# Patient Record
Sex: Male | Born: 1940
Health system: Southern US, Community
[De-identification: ages and names within clinical notes are randomized; demographics above are authoritative.]

## PROBLEM LIST (undated history)

## (undated) DIAGNOSIS — K219 Gastro-esophageal reflux disease without esophagitis: Secondary | ICD-10-CM

## (undated) DIAGNOSIS — Z974 Presence of external hearing-aid: Secondary | ICD-10-CM

## (undated) DIAGNOSIS — N400 Enlarged prostate without lower urinary tract symptoms: Secondary | ICD-10-CM

## (undated) DIAGNOSIS — M1990 Unspecified osteoarthritis, unspecified site: Secondary | ICD-10-CM

## (undated) HISTORY — PX: ESOPHAGOGASTRODUODENOSCOPY: SHX1529

## (undated) HISTORY — PX: TONSILLECTOMY: SUR1361

---

## 2007-05-21 ENCOUNTER — Ambulatory Visit: Payer: Self-pay | Admitting: Ophthalmology

## 2007-06-13 ENCOUNTER — Ambulatory Visit: Payer: Self-pay | Admitting: Gastroenterology

## 2009-10-23 HISTORY — PX: COLONOSCOPY: SHX174

## 2010-03-23 ENCOUNTER — Ambulatory Visit: Payer: Self-pay | Admitting: Gastroenterology

## 2010-07-04 ENCOUNTER — Ambulatory Visit: Payer: Self-pay | Admitting: Gastroenterology

## 2012-01-25 ENCOUNTER — Ambulatory Visit: Payer: Self-pay | Admitting: Internal Medicine

## 2014-04-14 LAB — CBC AND DIFFERENTIAL: Hemoglobin: 15.4 g/dL (ref 13.5–17.5)

## 2014-04-14 LAB — LIPID PANEL
Cholesterol: 189 mg/dL (ref 0–200)
HDL: 45 mg/dL (ref 35–70)
LDL Cholesterol: 112 mg/dL
Triglycerides: 161 mg/dL — AB (ref 40–160)

## 2014-04-14 LAB — PSA: PSA: 1.6

## 2014-04-14 LAB — BASIC METABOLIC PANEL
BUN: 11 mg/dL (ref 4–21)
Creatinine: 1 mg/dL (ref 0.6–1.3)

## 2015-10-07 ENCOUNTER — Encounter: Payer: Self-pay | Admitting: Internal Medicine

## 2015-10-07 ENCOUNTER — Other Ambulatory Visit: Payer: Self-pay | Admitting: Internal Medicine

## 2015-10-07 DIAGNOSIS — N138 Other obstructive and reflux uropathy: Secondary | ICD-10-CM | POA: Insufficient documentation

## 2015-10-07 DIAGNOSIS — D485 Neoplasm of uncertain behavior of skin: Secondary | ICD-10-CM | POA: Insufficient documentation

## 2015-10-07 DIAGNOSIS — Z86010 Personal history of colonic polyps: Secondary | ICD-10-CM | POA: Insufficient documentation

## 2015-10-07 DIAGNOSIS — N401 Enlarged prostate with lower urinary tract symptoms: Secondary | ICD-10-CM

## 2015-10-07 DIAGNOSIS — M722 Plantar fascial fibromatosis: Secondary | ICD-10-CM | POA: Insufficient documentation

## 2016-12-17 ENCOUNTER — Encounter: Payer: Self-pay | Admitting: Internal Medicine

## 2016-12-20 ENCOUNTER — Ambulatory Visit: Payer: Self-pay | Admitting: Internal Medicine

## 2017-01-26 DIAGNOSIS — R7309 Other abnormal glucose: Secondary | ICD-10-CM | POA: Diagnosis not present

## 2017-01-26 DIAGNOSIS — Z125 Encounter for screening for malignant neoplasm of prostate: Secondary | ICD-10-CM | POA: Diagnosis not present

## 2017-01-26 DIAGNOSIS — Z0000 Encounter for general adult medical examination without abnormal findings: Secondary | ICD-10-CM | POA: Diagnosis not present

## 2017-01-26 DIAGNOSIS — D369 Benign neoplasm, unspecified site: Secondary | ICD-10-CM | POA: Diagnosis not present

## 2017-03-28 DIAGNOSIS — K219 Gastro-esophageal reflux disease without esophagitis: Secondary | ICD-10-CM | POA: Diagnosis not present

## 2017-03-28 DIAGNOSIS — Z86010 Personal history of colonic polyps: Secondary | ICD-10-CM | POA: Diagnosis not present

## 2017-05-15 ENCOUNTER — Ambulatory Visit: Payer: Self-pay | Admitting: Internal Medicine

## 2017-07-31 DIAGNOSIS — Z0000 Encounter for general adult medical examination without abnormal findings: Secondary | ICD-10-CM | POA: Insufficient documentation

## 2017-07-31 DIAGNOSIS — Z23 Encounter for immunization: Secondary | ICD-10-CM | POA: Diagnosis not present

## 2017-07-31 DIAGNOSIS — R972 Elevated prostate specific antigen [PSA]: Secondary | ICD-10-CM | POA: Diagnosis not present

## 2017-08-06 ENCOUNTER — Encounter: Payer: Self-pay | Admitting: *Deleted

## 2017-08-07 ENCOUNTER — Encounter: Payer: Self-pay | Admitting: *Deleted

## 2017-08-07 ENCOUNTER — Encounter
Admission: RE | Disposition: A | Discharge status: Home / Self Care | Payer: Self-pay | Source: Ambulatory Visit | Attending: Gastroenterology

## 2017-08-07 ENCOUNTER — Ambulatory Visit
Admission: RE | Admit: 2017-08-07 | Discharge: 2017-08-07 | Disposition: A | Discharge status: Home / Self Care | Payer: PPO | Source: Ambulatory Visit | Attending: Gastroenterology | Admitting: Gastroenterology

## 2017-08-07 ENCOUNTER — Ambulatory Visit: Payer: PPO | Admitting: Anesthesiology

## 2017-08-07 DIAGNOSIS — D126 Benign neoplasm of colon, unspecified: Secondary | ICD-10-CM | POA: Diagnosis not present

## 2017-08-07 DIAGNOSIS — Z79899 Other long term (current) drug therapy: Secondary | ICD-10-CM | POA: Diagnosis not present

## 2017-08-07 DIAGNOSIS — Q438 Other specified congenital malformations of intestine: Secondary | ICD-10-CM | POA: Insufficient documentation

## 2017-08-07 DIAGNOSIS — K5730 Diverticulosis of large intestine without perforation or abscess without bleeding: Secondary | ICD-10-CM | POA: Diagnosis not present

## 2017-08-07 DIAGNOSIS — D124 Benign neoplasm of descending colon: Secondary | ICD-10-CM | POA: Insufficient documentation

## 2017-08-07 DIAGNOSIS — Z86010 Personal history of colonic polyps: Secondary | ICD-10-CM | POA: Insufficient documentation

## 2017-08-07 DIAGNOSIS — D122 Benign neoplasm of ascending colon: Secondary | ICD-10-CM | POA: Insufficient documentation

## 2017-08-07 DIAGNOSIS — N400 Enlarged prostate without lower urinary tract symptoms: Secondary | ICD-10-CM | POA: Diagnosis not present

## 2017-08-07 DIAGNOSIS — D125 Benign neoplasm of sigmoid colon: Secondary | ICD-10-CM | POA: Diagnosis not present

## 2017-08-07 DIAGNOSIS — K640 First degree hemorrhoids: Secondary | ICD-10-CM | POA: Insufficient documentation

## 2017-08-07 DIAGNOSIS — K648 Other hemorrhoids: Secondary | ICD-10-CM | POA: Diagnosis not present

## 2017-08-07 DIAGNOSIS — K5790 Diverticulosis of intestine, part unspecified, without perforation or abscess without bleeding: Secondary | ICD-10-CM | POA: Diagnosis not present

## 2017-08-07 DIAGNOSIS — K635 Polyp of colon: Secondary | ICD-10-CM | POA: Diagnosis not present

## 2017-08-07 DIAGNOSIS — Z1211 Encounter for screening for malignant neoplasm of colon: Secondary | ICD-10-CM | POA: Insufficient documentation

## 2017-08-07 HISTORY — PX: COLONOSCOPY WITH PROPOFOL: SHX5780

## 2017-08-07 HISTORY — DX: Benign prostatic hyperplasia without lower urinary tract symptoms: N40.0

## 2017-08-07 SURGERY — COLONOSCOPY WITH PROPOFOL
Anesthesia: General

## 2017-08-07 MED ORDER — PROPOFOL 500 MG/50ML IV EMUL
INTRAVENOUS | Status: AC
  Filled 2017-08-07: qty 50

## 2017-08-07 MED ORDER — PROPOFOL 10 MG/ML IV BOLUS
INTRAVENOUS | Status: DC | PRN
  Administered 2017-08-07: 100 mg via INTRAVENOUS

## 2017-08-07 MED ORDER — EPHEDRINE SULFATE 50 MG/ML IJ SOLN
INTRAMUSCULAR | Status: DC | PRN
  Administered 2017-08-07: 5 mg via INTRAVENOUS
  Administered 2017-08-07: 10 mg via INTRAVENOUS

## 2017-08-07 MED ORDER — FENTANYL CITRATE (PF) 100 MCG/2ML IJ SOLN
INTRAMUSCULAR | Status: AC
  Filled 2017-08-07: qty 2

## 2017-08-07 MED ORDER — SODIUM CHLORIDE 0.9 % IV SOLN: INTRAVENOUS | Status: DC

## 2017-08-07 MED ORDER — PHENYLEPHRINE HCL 10 MG/ML IJ SOLN
INTRAMUSCULAR | Status: DC | PRN
  Administered 2017-08-07 (×3): 100 ug via INTRAVENOUS

## 2017-08-07 MED ORDER — FENTANYL CITRATE (PF) 100 MCG/2ML IJ SOLN
INTRAMUSCULAR | Status: DC | PRN
  Administered 2017-08-07: 50 ug via INTRAVENOUS

## 2017-08-07 MED ORDER — LIDOCAINE 2% (20 MG/ML) 5 ML SYRINGE
INTRAMUSCULAR | Status: DC | PRN
  Administered 2017-08-07: 40 mg via INTRAVENOUS

## 2017-08-07 MED ORDER — SODIUM CHLORIDE 0.9 % IV SOLN
INTRAVENOUS | Status: DC
  Administered 2017-08-07: 1000 mL via INTRAVENOUS
  Administered 2017-08-07: 12:00:00 via INTRAVENOUS

## 2017-08-07 MED ORDER — PROPOFOL 500 MG/50ML IV EMUL
INTRAVENOUS | Status: DC | PRN
  Administered 2017-08-07: 160 ug/kg/min via INTRAVENOUS

## 2017-08-07 NOTE — Op Note (Signed)
[facility] Gastroenterology Patient Name: Joel Carter Procedure Date: 08/07/2017 12:22 PM MRN: [id number] Account #: [id number] Date of Birth: 10-11-1941 Admit Type: Outpatient Age: 76 Room: [facility] ENDO ROOM 3 Gender: Male Note Status: Finalized Procedure:            Colonoscopy Indications:          Personal history of colonic polyps Providers:            Lollie Sails, MD Referring MD:         [name], MD (Referring MD) Medicines:            Monitored Anesthesia Care Complications:        No immediate complications. Procedure:            Pre-Anesthesia Assessment:                       - ASA Grade Assessment: III - A patient with severe                        systemic disease.                       After obtaining informed consent, the colonoscope was                        passed under direct vision. Throughout the procedure,                        the patient's blood pressure, pulse, and oxygen                        saturations were monitored continuously. The                        Colonoscope was introduced through the anus and                        advanced to the the cecum, identified by appendiceal                        orifice and ileocecal valve. The colonoscopy was                        performed with moderate difficulty due to a tortuous                        colon. Successful completion of the procedure was aided                        by changing the patient to a supine position, changing                        the patient to a prone position and using manual                        pressure. The patient tolerated the procedure well. The                        quality of the bowel preparation was good. Findings:      A 3 mm polyp  was found in the mid sigmoid colon. The polyp was sessile.       The polyp was removed with a cold biopsy forceps. Resection and       retrieval were complete.      A 3 mm polyp was found in the  descending colon. The polyp was sessile.       The polyp was removed with a cold biopsy forceps. Resection and       retrieval were complete.      A 4 mm polyp was found in the descending colon. The polyp was sessile.       The polyp was removed with a cold snare. Resection and retrieval were       complete.      Two sessile and semi-pedunculated polyps were found in the proximal       ascending colon. The polyps were 2 to 4 mm in size. These polyps were       removed with a cold biopsy forceps. Resection and retrieval were       complete.      A few small-mouthed diverticula were found in the sigmoid colon and       distal descending colon.      Non-bleeding internal hemorrhoids were found during anoscopy. The       hemorrhoids were small and Grade I (internal hemorrhoids that do not       prolapse).      The digital rectal exam was normal otherwise. Impression:           - One 3 mm polyp in the mid sigmoid colon, removed with                        a cold biopsy forceps. Resected and retrieved.                       - One 3 mm polyp in the descending colon, removed with                        a cold biopsy forceps. Resected and retrieved.                       - One 4 mm polyp in the descending colon, removed with                        a cold snare. Resected and retrieved.                       - Two 2 to 4 mm polyps in the proximal ascending colon,                        removed with a cold biopsy forceps. Resected and                        retrieved.                       - Diverticulosis in the sigmoid colon and in the distal                        descending colon.                       -  Non-bleeding internal hemorrhoids. Recommendation:       - Discharge patient to home.                       - Telephone GI clinic for pathology results in 1 week. Procedure Code(s):    --- Professional ---                       [phone], Colonoscopy, flexible; with removal of tumor(s),                         polyp(s), or other lesion(s) by snare technique                       45380, 21, Colonoscopy, flexible; with biopsy, single                        or multiple Diagnosis Code(s):    --- Professional ---                       D12.5, Benign neoplasm of sigmoid colon                       D12.4, Benign neoplasm of descending colon                       D12.2, Benign neoplasm of ascending colon                       K64.0, First degree hemorrhoids                       Z86.010, Personal history of colonic polyps                       K57.30, Diverticulosis of large intestine without                        perforation or abscess without bleeding CPT copyright 2016 American Medical Association. All rights reserved. The codes documented in this report are preliminary and upon coder review may  be revised to meet current compliance requirements. Lollie Sails, MD 08/07/2017 1:24:28 PM This report has been signed electronically. Number of Addenda: 0 Note Initiated On: 08/07/2017 12:22 PM Scope Withdrawal Time: 0 hours 21 minutes 57 seconds  Total Procedure Duration: 0 hours 50 minutes 22 seconds       [facility]

## 2017-08-07 NOTE — Transfer of Care (Signed)
Immediate Anesthesia Transfer of Care Note  Patient: Joel Carter  Procedure(s) Performed: COLONOSCOPY WITH PROPOFOL (N/A )  Patient Location: PACU and Endoscopy Unit  Anesthesia Type:General  Level of Consciousness: sedated  Airway & Oxygen Therapy: Patient Spontanous Breathing and Patient connected to nasal cannula oxygen  Post-op Assessment: Report given to RN and Post -op Vital signs reviewed and stable  Post vital signs: Reviewed and stable  Last Vitals:  Vitals:   08/07/17 1203  BP: (!) 141/80  Pulse: 66  Resp: 16  Temp: (!) 36 C  SpO2: 100%    Last Pain:  Vitals:   08/07/17 1203  TempSrc: Tympanic         Complications: No apparent anesthesia complications

## 2017-08-07 NOTE — H&P (Signed)
Outpatient short stay form Pre-procedure 08/07/2017 12:13 PM Lollie Sails MD  Primary Physician: Dr. Emily Filbert  Reason for visit:  colonoscopy  History of present illness:  Patient is a 76-year-ve. He tolerated his prep well. He takes no aspirin or blood thinning agents. His last colonoscopy was in 2011 with a single tubular adenoma at that time.    Current Facility-Administered Medications:  .  0.9 %  sodium chloride infusion, , Intravenous, Continuous, Lollie Sails, MD .  0.9 %  sodium chloride infusion, , Intravenous, Continuous, Lollie Sails, MD  Prescriptions Prior to Admission  Medication Sig Dispense Refill Last Dose  . polyethylene glycol powder (GLYCOLAX/MIRALAX) powder Take 1 Container by mouth once.   Past Week at Unknown time  . tamsulosin (FLOMAX) 0.4 MG CAPS capsule Take 0.4 mg by mouth daily.   Past Week at Unknown time  . dutasteride (AVODART) 0.5 MG capsule Take 1 capsule by mouth at bedtime.   Not Taking at Unknown time     No Known Allergies   Past Medical History:  Diagnosis Date  . BPH without obstruction/lower urinary tract symptoms     Review of systems:      Physical Exam    Heart and lungs: regular rate and rhythm without rub or gallop, lungs are bilaterally clear    HEENT: normocephalic atraumatic eyes are anicteric    Other:     Pertinant exam for procedure: soft nontender nondistended bowel sounds normoactive    Planned proceedures: colonoscopy and indicated procedures I have discussed the risks benefits and complications of procedures to include not limited to bleeding, infection, perforation and the risk of sedation and the patient wishes to proceed.    Lollie Sails, MD Gastroenterology 08/07/2017  12:13 PM

## 2017-08-07 NOTE — Anesthesia Postprocedure Evaluation (Signed)
Anesthesia Post Note  Patient: Joel Carter  Procedure(s) Performed: COLONOSCOPY WITH PROPOFOL (N/A )  Patient location during evaluation: Endoscopy Anesthesia Type: General Level of consciousness: awake and alert Pain management: pain level controlled Vital Signs Assessment: post-procedure vital signs reviewed and stable Respiratory status: spontaneous breathing and respiratory function stable Cardiovascular status: stable Anesthetic complications: no     Last Vitals:  Vitals:   08/07/17 1325 08/07/17 1335  BP: (!) 101/58 (!) 95/56  Pulse: 63 61  Resp: 13 19  Temp: (!) 36.1 C   SpO2: 98%     Last Pain:  Vitals:   08/07/17 1325  TempSrc: Tympanic                 KEPHART,WILLIAM K

## 2017-08-07 NOTE — Anesthesia Post-op Follow-up Note (Signed)
Anesthesia QCDR form completed.        

## 2017-08-07 NOTE — Anesthesia Preprocedure Evaluation (Signed)
Anesthesia Evaluation  Patient identified by MRN, date of birth, ID band Patient awake    Reviewed: Allergy & Precautions, NPO status , Patient's Chart, lab work & pertinent test results  History of Anesthesia Complications Negative for: history of anesthetic complications  Airway Mallampati: II       Dental   Pulmonary neg sleep apnea, neg COPD,           Cardiovascular (-) hypertension(-) Past MI and (-) CHF (-) dysrhythmias (-) Valvular Problems/Murmurs     Neuro/Psych neg Seizures    GI/Hepatic Neg liver ROS, neg GERD  ,  Endo/Other  neg diabetes  Renal/GU negative Renal ROS     Musculoskeletal   Abdominal   Peds  Hematology   Anesthesia Other Findings   Reproductive/Obstetrics                             Anesthesia Physical Anesthesia Plan  ASA: I  Anesthesia Plan: General   Post-op Pain Management:    Induction: Intravenous  PONV Risk Score and Plan: Propofol infusion  Airway Management Planned: Nasal Cannula  Additional Equipment:   Intra-op Plan:   Post-operative Plan:   Informed Consent: I have reviewed the patients History and Physical, chart, labs and discussed the procedure including the risks, benefits and alternatives for the proposed anesthesia with the patient or authorized representative who has indicated his/her understanding and acceptance.     Plan Discussed with:   Anesthesia Plan Comments:         Anesthesia Quick Evaluation

## 2017-08-08 ENCOUNTER — Encounter: Payer: Self-pay | Admitting: Gastroenterology

## 2017-08-08 LAB — SURGICAL PATHOLOGY

## 2017-08-22 ENCOUNTER — Ambulatory Visit: Payer: PPO | Admitting: Urology

## 2017-08-22 ENCOUNTER — Encounter: Payer: Self-pay | Admitting: Urology

## 2017-08-22 VITALS — BP 122/79 | HR 77 | Resp 3 | Ht 68.0 in | Wt 218.1 lb

## 2017-08-22 DIAGNOSIS — R972 Elevated prostate specific antigen [PSA]: Secondary | ICD-10-CM

## 2017-08-22 DIAGNOSIS — N401 Enlarged prostate with lower urinary tract symptoms: Secondary | ICD-10-CM | POA: Diagnosis not present

## 2017-08-22 DIAGNOSIS — N138 Other obstructive and reflux uropathy: Secondary | ICD-10-CM

## 2017-08-22 LAB — URINALYSIS, COMPLETE
BILIRUBIN UA: NEGATIVE
Glucose, UA: NEGATIVE
Ketones, UA: NEGATIVE
Leukocytes, UA: NEGATIVE
NITRITE UA: NEGATIVE
PH UA: 6.5 (ref 5.0–7.5)
PROTEIN UA: NEGATIVE
RBC UA: NEGATIVE
SPEC GRAV UA: 1.015 (ref 1.005–1.030)
UUROB: 0.2 mg/dL (ref 0.2–1.0)

## 2017-08-22 MED ORDER — DIAZEPAM 10 MG PO TABS: ORAL_TABLET | ORAL | 0 refills | Status: DC

## 2017-08-22 NOTE — Progress Notes (Signed)
08/22/2017 3:22 PM   [name] January 29, 1941 [id number]  Referring provider: [name], MD [place] [facility] [place], [place] [address]  Chief Complaint  Patient presents with  . Elevated PSA    HPI: Joel Carter is a 76 y o. male seen in consultation at the request of Dr. Sabra Heck for evaluation of an elevated PSA.  A PSA performed in April 2018 was 5.28 which was repeated in October 2018 and was 5.56.  He has moderate lower urinary tract symptoms consisting of urinary frequency, urgency, nocturia x2-3 and decreased force and caliber of his stream with intermittency.  He is on tamsulosin.  He had previously seen Dr. Carolin Coy and was on Avodart in 2014 which he took for approximately 1 year but discontinued because he could not donate blood.  A PSA in 2015 was 1.6.  He denies dysuria or gross hematuria.  Denies flank, abdominal, pelvic or scrotal pain.  He denies prior urologic evaluation.   PMH: Past Medical History:  Diagnosis Date  . BPH without obstruction/lower urinary tract symptoms     Surgical History: Past Surgical History:  Procedure Laterality Date  . COLONOSCOPY  2011   tubular adenoma  . COLONOSCOPY WITH PROPOFOL N/A 08/07/2017   Procedure: COLONOSCOPY WITH PROPOFOL;  Surgeon: Lollie Sails, MD;  Location: [facility] ENDOSCOPY;  Service: Endoscopy;  Laterality: N/A;  . ESOPHAGOGASTRODUODENOSCOPY    . TONSILLECTOMY      Home Medications:  Allergies as of 08/22/2017   No Known Allergies     Medication List       Accurate as of 08/22/17  3:22 PM. Always use your most recent med list.          diazepam 10 MG tablet Commonly known as:  VALIUM 1 tab 30 min prior to procedure   multivitamin capsule Take 1 capsule by mouth daily.   tamsulosin 0.4 MG Caps capsule Commonly known as:  FLOMAX Take 0.4 mg by mouth daily.       Allergies: No Known Allergies  Family History: Family History  Problem Relation  Age of Onset  . Diabetes Unknown        multiple family members  . Dementia Mother     Social History:  reports that he has never smoked. He has never used smokeless tobacco. He reports that he drinks about 2.4 oz of alcohol per week . He reports that he does not use drugs.  ROS: UROLOGY Frequent Urination?: Yes Hard to postpone urination?: Yes Burning/pain with urination?: No Get up at night to urinate?: Yes Leakage of urine?: No Urine stream starts and stops?: Yes Trouble starting stream?: No Do you have to strain to urinate?: No Blood in urine?: No Urinary tract infection?: No Sexually transmitted disease?: No Injury to kidneys or bladder?: No Painful intercourse?: No Weak stream?: Yes Erection problems?: No Penile pain?: No  Gastrointestinal Nausea?: No Vomiting?: No Indigestion/heartburn?: No Diarrhea?: No Constipation?: No  Constitutional Fever: No Night sweats?: No Weight loss?: No Fatigue?: No  Skin Skin rash/lesions?: No Itching?: No  Eyes Blurred vision?: No Double vision?: No  Ears/Nose/Throat Sore throat?: No Sinus problems?: No  Hematologic/Lymphatic Swollen glands?: No Easy bruising?: No  Cardiovascular Leg swelling?: No Chest pain?: No  Respiratory Cough?: No Shortness of breath?: No  Endocrine Excessive thirst?: No  Musculoskeletal Back pain?: No Joint pain?: No  Neurological Headaches?: No Dizziness?: No  Psychologic Depression?: No Anxiety?: No  Physical Exam: BP 122/79 (BP Location:  Right Arm, Patient Position: Sitting, Cuff Size: Normal)   Pulse 77   Resp (!) 3   Ht 5\' 8"  (1.727 m)   Wt 218 lb 1.6 oz (98.9 kg)   BMI 33.16 kg/m   Constitutional:  Alert and oriented, No acute distress. HEENT: [place] AT, moist mucus membranes.  Trachea midline, no masses. Cardiovascular: No clubbing, cyanosis, or edema. Respiratory: Normal respiratory effort, no increased work of breathing. GI: Abdomen is soft, nontender,  nondistended, no abdominal masses GU: No CVA tenderness.  Penis circumcised without lesions, testes descended bilaterally and are slightly atrophic.  No masses or tenderness.  Cord/epididymes palpably normal.  Prostate 50 g, smooth without nodules. Skin: No rashes, bruises or suspicious lesions. Lymph: No cervical or inguinal adenopathy. Neurologic: Grossly intact, no focal deficits, moving all 4 extremities. Psychiatric: Normal mood and affect.  Laboratory Data: Lab Results  Component Value Date   HGB 15.4 04/14/2014    Lab Results  Component Value Date   CREATININE 1.0 04/14/2014    Urinalysis Dipstick/microscopy negative   Assessment & Plan:    1. Elevated PSA Although PSA is a prostate cancer screening test he was informed that cancer is not the most common cause of an elevated PSA. Other potential causes including BPH and inflammation were discussed. He was informed that the only way to adequately diagnose prostate cancer would be a transrectal ultrasound and biopsy of the prostate. The procedure was discussed including potential risks of bleeding and infection/sepsis. He was also informed that a negative biopsy does not conclusively rule out the possibility that prostate cancer may be present and that continued monitoring is required. The use of newer adjunctive blood tests including PHI was discussed. The use of multiparametric prostate MRI was also discussed however is not typically used for initial evaluation of an elevated PSA. Continued periodic surveillance was also discussed.   He states he is concerned about the possibility of prostate cancer and desires to schedule a biopsy.  The procedure was discussed including potential risks of bleeding and infection/sepsis.   2. Benign prostatic hyperplasia with urinary obstruction Moderate lower urinary tract symptoms on tamsulosin.  He inquired about going back on Avodart however I recommended until we get the biopsy results.  -  Urinalysis, Complete    Abbie Sons, MD  [facility] [address], [place],  [address] [phone]

## 2017-08-29 ENCOUNTER — Other Ambulatory Visit: Payer: Self-pay | Admitting: Urology

## 2017-08-29 ENCOUNTER — Encounter: Payer: Self-pay | Admitting: Urology

## 2017-08-29 ENCOUNTER — Ambulatory Visit: Payer: PPO | Admitting: Urology

## 2017-08-29 VITALS — BP 133/87 | HR 83 | Ht 68.0 in | Wt 218.2 lb

## 2017-08-29 DIAGNOSIS — R972 Elevated prostate specific antigen [PSA]: Secondary | ICD-10-CM | POA: Diagnosis not present

## 2017-08-29 MED ORDER — LEVOFLOXACIN 500 MG PO TABS
500.0000 mg | ORAL_TABLET | Freq: Once | ORAL | Status: AC
Start: 2017-08-29 — End: 2017-08-29
  Administered 2017-08-29: 500 mg via ORAL

## 2017-08-29 MED ORDER — LIDOCAINE HCL 2 % EX GEL
1.0000 "application " | Freq: Once | CUTANEOUS | Status: AC
  Administered 2017-08-29: 1 via URETHRAL

## 2017-08-29 MED ORDER — GENTAMICIN SULFATE 40 MG/ML IJ SOLN
80.0000 mg | Freq: Once | INTRAMUSCULAR | Status: AC
  Administered 2017-08-29: 80 mg via INTRAMUSCULAR

## 2017-08-29 NOTE — Progress Notes (Signed)
Prostate Biopsy Procedure   Informed consent was obtained after discussing risks/benefits of the procedure.  A time out was performed to ensure correct patient identity.  Pre-Procedure: - Last PSA Level: 5.56  - Gentamicin given prophylactically - Levaquin 500 mg administered PO -Transrectal Ultrasound performed revealing a 75 gm prostate -No significant hypoechoic or median lobe noted  Procedure: - Prostate block performed using 10 cc 1% lidocaine and biopsies taken from sextant areas, a total of 12 under ultrasound guidance.  Post-Procedure: - Patient tolerated the procedure well - He was counseled to seek immediate medical attention if experiences any severe pain, significant bleeding, or fevers - Return in one week to discuss biopsy results

## 2017-09-04 ENCOUNTER — Other Ambulatory Visit: Payer: Self-pay | Admitting: Urology

## 2017-09-04 LAB — PATHOLOGY REPORT

## 2017-09-05 ENCOUNTER — Telehealth: Payer: Self-pay | Admitting: Urology

## 2017-09-05 NOTE — Telephone Encounter (Signed)
Please notify patient his prostate biopsy showed no evidence of cancer.  Recommend a follow-up PSA/DRE in 6 months.  His follow-up appointment can be canceled unless he needs to discuss any other issues.

## 2017-09-06 ENCOUNTER — Ambulatory Visit (INDEPENDENT_AMBULATORY_CARE_PROVIDER_SITE_OTHER): Payer: PPO

## 2017-09-06 VITALS — BP 131/78 | HR 84 | Ht 68.0 in | Wt 218.0 lb

## 2017-09-06 DIAGNOSIS — R300 Dysuria: Secondary | ICD-10-CM | POA: Diagnosis not present

## 2017-09-06 LAB — URINALYSIS, COMPLETE
BILIRUBIN UA: NEGATIVE
Glucose, UA: NEGATIVE
Ketones, UA: NEGATIVE
Nitrite, UA: POSITIVE — AB
PH UA: 7 (ref 5.0–7.5)
PROTEIN UA: NEGATIVE
Specific Gravity, UA: 1.015 (ref 1.005–1.030)
UUROB: 0.2 mg/dL (ref 0.2–1.0)

## 2017-09-06 LAB — MICROSCOPIC EXAMINATION: EPITHELIAL CELLS (NON RENAL): NONE SEEN /HPF (ref 0–10)

## 2017-09-06 MED ORDER — CIPROFLOXACIN HCL 250 MG PO TABS: 250.0000 mg | ORAL_TABLET | Freq: Two times a day (BID) | ORAL | 0 refills | Status: AC

## 2017-09-06 NOTE — Telephone Encounter (Signed)
LMOM

## 2017-09-06 NOTE — Telephone Encounter (Signed)
Spoke with pt in reference to negative prostate bx and needing a f/u in 33mo. Pt voiced understanding. OV and lab appt made. Orders placed.   Pt also stated that he does not feel that the flomax is working. Pt stated that he was previously was taking advodart and but quit taking it as he was concerned about giving blood. Pt inquired about going back on advodart or another medication to help him empty his bladder. Please advise.

## 2017-09-06 NOTE — Progress Notes (Signed)
Pt presented today with c/o urinary frequency and urgency, hard to postpone urination, dysuria, and leakage of urine. A clean catch was obtained for u/a and cx.   Blood pressure 131/78, pulse 84, height 5\' 8"  (1.727 m), weight 218 lb (98.9 kg).  Per Larene [place] cipro 250mg  bid x14 days was called into pt pharmacy.

## 2017-09-07 NOTE — Telephone Encounter (Signed)
It looks like urinalysis yesterday is positive for infection.  Joel Carter seen in [place].  If his symptoms have been worse since the biopsy it is most likely due to infection.  Avodart is going to take at least 4 months before he will see any benefit.  Please verify when symptoms worsened.  Thanks

## 2017-09-08 ENCOUNTER — Emergency Department
Admission: EM | Admit: 2017-09-08 | Discharge: 2017-09-08 | Disposition: A | Discharge status: Home / Self Care | Payer: PPO | Attending: Emergency Medicine | Admitting: Emergency Medicine

## 2017-09-08 DIAGNOSIS — R339 Retention of urine, unspecified: Secondary | ICD-10-CM | POA: Diagnosis not present

## 2017-09-08 DIAGNOSIS — N390 Urinary tract infection, site not specified: Secondary | ICD-10-CM | POA: Diagnosis not present

## 2017-09-08 DIAGNOSIS — R300 Dysuria: Secondary | ICD-10-CM | POA: Diagnosis present

## 2017-09-08 LAB — URINALYSIS, COMPLETE (UACMP) WITH MICROSCOPIC
BACTERIA UA: NONE SEEN
Bilirubin Urine: NEGATIVE
GLUCOSE, UA: NEGATIVE mg/dL
Hgb urine dipstick: NEGATIVE
Ketones, ur: NEGATIVE mg/dL
Leukocytes, UA: NEGATIVE
Nitrite: NEGATIVE
PH: 6 (ref 5.0–8.0)
PROTEIN: NEGATIVE mg/dL
SQUAMOUS EPITHELIAL / LPF: NONE SEEN
Specific Gravity, Urine: 1.009 (ref 1.005–1.030)

## 2017-09-08 LAB — CULTURE, URINE COMPREHENSIVE

## 2017-09-08 MED ORDER — LIDOCAINE HCL 2 % EX GEL
CUTANEOUS | Status: AC
  Filled 2017-09-08: qty 10

## 2017-09-08 MED ORDER — LIDOCAINE HCL 2 % EX GEL: 1.0000 "application " | Freq: Once | CUTANEOUS | Status: DC

## 2017-09-08 NOTE — ED Triage Notes (Addendum)
Pt states was diagnosed with a urinary tract infection this week and started on antibiotics. Pt states has dysuria, frequency that has not improved since starting antibiotic. Pt denies chills, fever, nausea, vomiting.

## 2017-09-08 NOTE — ED Notes (Signed)
Patient given extra supplies and education on changing out leg bag and care of the foley with specific instructions on emptying and not having the drainage bag above insertion point.

## 2017-09-08 NOTE — Discharge Instructions (Signed)
Follow up with your urologist in 2-3 days for repeat evaluation of your urinary retention. Continue taking your cipro for the UTI until it is finished or your doctor tells you to discontinue it. The urine culture and current results show that the cipro is working.

## 2017-09-08 NOTE — ED Provider Notes (Signed)
[facility] Emergency Department Provider Note  ____________________________________________  Time seen: Approximately 11:09 PM  I have reviewed the triage vital signs and the nursing notes.   HISTORY  Chief Complaint Urinary Frequency and Dysuria    HPI Joel Carter is a 76 y.o. male who complains of dysuria and urinary frequency and suprapubic pain for the past 3 or 4 days. Saw his urology clinic, started on Cipro for a UTI. I reviewed the electronic medical record and a urine culture results in 2 days ago shows that his UTI is susceptible to Cipro. Has been drinking lots of water, but feels like he has trouble emptying his bladder    Past Medical History:  Diagnosis Date  . BPH without obstruction/lower urinary tract symptoms      Patient Active Problem List   Diagnosis Date Noted  . Benign prostatic hyperplasia with urinary obstruction 10/07/2015  . H/O adenomatous polyp of colon 10/07/2015  . Plantar fasciitis 10/07/2015  . Neoplasm of uncertain behavior of skin 10/07/2015     Past Surgical History:  Procedure Laterality Date  . COLONOSCOPY  2011   tubular adenoma  . COLONOSCOPY WITH PROPOFOL N/A 08/07/2017   Performed by Lollie Sails, MD at [facility]  . ESOPHAGOGASTRODUODENOSCOPY    . TONSILLECTOMY       Prior to Admission medications   Medication Sig Start Date End Date Taking? Authorizing Provider  ciprofloxacin (CIPRO) 250 MG tablet Take 1 tablet (250 mg total) 2 (two) times daily for 14 days by mouth. 09/06/17 09/20/17 Yes McGowan, Larene Beach A, PA-C  Multiple Vitamin (MULTIVITAMIN) capsule Take 1 capsule by mouth daily.   Yes [provider]  tamsulosin (FLOMAX) 0.4 MG CAPS capsule Take 0.4 mg by mouth daily.   Yes [provider]     Allergies Patient has no known allergies.   Family History  Problem Relation Age of Onset  . Diabetes Unknown        multiple family members  . Dementia Mother      Social History Social History   Tobacco Use  . Smoking status: Never Smoker  . Smokeless tobacco: Never Used  Substance Use Topics  . Alcohol use: Yes    Alcohol/week: 2.4 oz    Types: 4 Standard drinks or equivalent per week  . Drug use: No    Review of Systems  Constitutional:   No fever or chills.  ENT:   No sore throat. No rhinorrhea. Cardiovascular:   No chest pain or syncope. Respiratory:   No dyspnea or cough. Gastrointestinal:   Negative for abdominal pain, vomiting and diarrhea.  Musculoskeletal:   Negative for focal pain or swelling All other systems reviewed and are negative except as documented above in ROS and HPI.  ____________________________________________   PHYSICAL EXAM:  VITAL SIGNS: ED Triage Vitals  Enc Vitals Group     BP 09/08/17 2054 113/61     Pulse Rate 09/08/17 2054 87     Resp 09/08/17 2054 18     Temp 09/08/17 2054 98.2 F (36.8 C)     Temp Source 09/08/17 2054 Oral     SpO2 09/08/17 2054 97 %     Weight 09/08/17 2055 218 lb (98.9 kg)     Height 09/08/17 2055 5\' 8"  (1.727 m)     Head Circumference --      Peak Flow --      Pain Score --      Pain Loc --  Pain Edu? --      Excl. in [place]? --     Vital signs reviewed, nursing assessments reviewed.   Constitutional:   Alert and oriented. Well appearing and in no distress. Eyes:   No scleral icterus.  EOMI. No nystagmus. No conjunctival pallor. PERRL. ENT   Head:   Normocephalic and atraumatic.   Nose:   No congestion/rhinnorhea.    Mouth/Throat:   MMM, no pharyngeal erythema. No peritonsillar mass.    Neck:   No meningismus. Full ROM. Hematological/Lymphatic/Immunilogical:   No cervical lymphadenopathy. Cardiovascular:   RRR. Symmetric bilateral radial and DP pulses.  No murmurs.  Respiratory:   Normal respiratory effort without tachypnea/retractions. Breath sounds are clear and equal bilaterally. No wheezes/rales/rhonchi. Gastrointestinal:   Soft with  suprapubic fullness and tenderness. Non distended. There is no CVA tenderness.  No rebound, rigidity, or guarding. Genitourinary:   Normal Musculoskeletal:   Normal range of motion in all extremities. No joint effusions.  No lower extremity tenderness.  No edema. Neurologic:   Normal speech and language.  Motor grossly intact. No gross focal neurologic deficits are appreciated.  Skin:    Skin is warm, dry and intact. No rash noted.  No petechiae, purpura, or bullae.  ____________________________________________    LABS (pertinent positives/negatives) (all labs ordered are listed, but only abnormal results are displayed) Labs Reviewed  URINALYSIS, COMPLETE (UACMP) WITH MICROSCOPIC - Abnormal; Notable for the following components:      Result Value   Color, Urine YELLOW (*)    APPearance CLEAR (*)    All other components within normal limits   ____________________________________________   EKG    ____________________________________________    RADIOLOGY  No results found.  ____________________________________________   PROCEDURES Procedures  ____________________________________________     CLINICAL IMPRESSION / ASSESSMENT AND PLAN / ED COURSE  Pertinent labs & imaging results that were available during my care of the patient were reviewed by me and considered in my medical decision making (see chart for details).   Patient well-appearing no acute distress normal vital signs, presents with urinary symptoms. Urine culture proves that his Cipro as it is efficacious. Urinalysis today is normal. He was would residual is 325 consistent with urinary retention - presumably due to enlarged prostate, so place a Foley and have the patient follow up with his urologist. At this degrees unlikely that the patient would have any resulting degree of renal impairment. No evidence of pyelonephritis or sepsis.      ____________________________________________   FINAL CLINICAL  IMPRESSION(S) / ED DIAGNOSES    Final diagnoses:  Lower urinary tract infectious disease  Urinary retention      This SmartLink is deprecated. Use AVSMEDLIST instead to display the medication list for a patient.   Portions of this note were generated with dragon dictation software. Dictation errors may occur despite best attempts at proofreading.    Carrie Mew, MD 09/08/17 [phone]

## 2017-09-10 ENCOUNTER — Telehealth: Payer: Self-pay

## 2017-09-10 MED ORDER — OXYBUTYNIN CHLORIDE 5 MG PO TABS: ORAL_TABLET | ORAL | 0 refills | Status: DC

## 2017-09-10 NOTE — Telephone Encounter (Signed)
Rx oxybutynin sent to his pharmacy.  Is he still taking tamsulosin?

## 2017-09-10 NOTE — Telephone Encounter (Signed)
Pt called stating he had to go to the ER on Saturday and have a foley placed. Pt states that he is having bladder spasms and is requesting a medication to help with them. Please advise.

## 2017-09-11 NOTE — Telephone Encounter (Signed)
No you still need to do this

## 2017-09-11 NOTE — Telephone Encounter (Signed)
Did you handle this? 

## 2017-09-11 NOTE — Telephone Encounter (Signed)
Spoke with pt in reference to flomax. Pt stated that he is still taking flomax. Pt also stated he picked up oxybutynin.

## 2017-09-11 NOTE — Telephone Encounter (Signed)
LMOM

## 2017-09-18 ENCOUNTER — Ambulatory Visit: Payer: PPO | Admitting: Urology

## 2017-09-21 ENCOUNTER — Ambulatory Visit: Payer: PPO | Admitting: Urology

## 2017-09-21 VITALS — BP 124/80 | HR 88 | Ht 68.0 in | Wt 222.2 lb

## 2017-09-21 DIAGNOSIS — R972 Elevated prostate specific antigen [PSA]: Secondary | ICD-10-CM | POA: Diagnosis not present

## 2017-09-21 DIAGNOSIS — R338 Other retention of urine: Secondary | ICD-10-CM | POA: Diagnosis not present

## 2017-09-21 DIAGNOSIS — N401 Enlarged prostate with lower urinary tract symptoms: Secondary | ICD-10-CM

## 2017-09-21 LAB — BLADDER SCAN AMB NON-IMAGING

## 2017-09-21 MED ORDER — FINASTERIDE 5 MG PO TABS: 5.0000 mg | ORAL_TABLET | Freq: Every day | ORAL | 3 refills | Status: DC

## 2017-09-21 MED ORDER — OXYBUTYNIN CHLORIDE 5 MG PO TABS: 5.0000 mg | ORAL_TABLET | Freq: Two times a day (BID) | ORAL | 2 refills | Status: DC

## 2017-09-21 NOTE — Progress Notes (Signed)
Fill and Pull Catheter Removal  Patient is present today for a catheter removal.  Patient was cleaned and prepped in a sterile fashion 269ml of sterile water/ saline was instilled into the bladder when the patient felt the urge to urinate. 39ml of water was then drained from the balloon.  A 16FR foley cath was removed from the bladder no complications were noted .  Patient as then given some time to void on their own.  Patient Can void  159ml on their own after some time.  Patient tolerated well.  Preformed by: Elberta Leatherwood, CMA  Follow up/ Additional notes: today at 3 PM for PVR  Patient returned for PVR. Bladder Scan Patient cannot void: 411 ml Performed By: C. Corinna Capra, CMA  Simple Catheter Placement  Due to urinary retention patient is present today for a foley cath placement.  Patient was cleaned and prepped in a sterile fashion with betadine and lidocaine jelly 2% was instilled into the urethra.  A 16 FR foley catheter was inserted, urine return was noted 429ml, urine was yellow in color.  The balloon was filled with 10cc of sterile water.  A leg bag was attached for drainage. Patient was also given a night bag to take home and was given instruction on how to change from one bag to another.  Patient was given instruction on proper catheter care.  Patient tolerated well, no complications were noted   Preformed by: C. Corinna Capra, CMA and Luz Lex, CMA  Additional notes/ Follow up: 1-2 weeks on nurse schedule

## 2017-09-21 NOTE — Progress Notes (Signed)
09/21/2017 8:32 AM   Joel Carter [birth year] [id number]  Referring provider: [name], MD [facility] [place], [place] [address]  Chief Complaint  Patient presents with  . Follow-up    ER    HPI: 76 y.o.  male status post prostate biopsy on 08/29/2017 for PSA of 5.56.  His prostate volume was calculated at 75.cc he did develop an E. coli UTI post procedure and 24 hours after starting antibiotics was seen in the ED for urinary retention and had a Foley catheter placed.  He remains on tamsulosin.  He finished his antibiotics 2 days ago.  Other than catheter irritation he has no complaints today.  Pathology: All cores of benign prostate tissue and some showing chronic inflammatory change.   PMH: Past Medical History:  Diagnosis Date  . BPH without obstruction/lower urinary tract symptoms     Surgical History: Past Surgical History:  Procedure Laterality Date  . COLONOSCOPY  2011   tubular adenoma  . COLONOSCOPY WITH PROPOFOL N/A 08/07/2017   Procedure: COLONOSCOPY WITH PROPOFOL;  Surgeon: Joel Sails, MD;  Location[facility] ENDOSCOPY;  Service: Endoscopy;  Laterality: N/A;  . ESOPHAGOGASTRODUODENOSCOPY    . TONSILLECTOMY      Home Medications:  Allergies as of 09/21/2017   No Known Allergies     Medication List        Accurate as of 09/21/17  8:32 AM. Always use your most recent med list.          multivitamin capsule Take 1 capsule by mouth daily.   tamsulosin 0.4 MG Caps capsule Commonly known as:  FLOMAX Take 0.4 mg by mouth daily.       Allergies: No Known Allergies  Family History: Family History  Problem Relation Age of Onset  . Diabetes Unknown        multiple family members  . Dementia Mother     Social History:  reports that  has never smoked. he has never used smokeless tobacco. He reports that he drinks about 2.4 oz of alcohol per week. He reports that he does not use  drugs.  ROS: UROLOGY Frequent Urination?: Yes Hard to postpone urination?: Yes Burning/pain with urination?: No Get up at night to urinate?: Yes Leakage of urine?: No Urine stream starts and stops?: Yes Trouble starting stream?: No Do you have to strain to urinate?: No Blood in urine?: No Urinary tract infection?: No Sexually transmitted disease?: No Injury to kidneys or bladder?: No Painful intercourse?: No Weak stream?: Yes Erection problems?: No Penile pain?: No  Gastrointestinal Nausea?: No Vomiting?: No Indigestion/heartburn?: No Diarrhea?: No Constipation?: No  Constitutional Fever: No Night sweats?: No Weight loss?: No Fatigue?: No  Skin Skin rash/lesions?: No Itching?: No  Eyes Blurred vision?: No Double vision?: No  Ears/Nose/Throat Sore throat?: No Sinus problems?: No  Hematologic/Lymphatic Swollen glands?: No Easy bruising?: No  Cardiovascular Leg swelling?: No Chest pain?: No  Respiratory Cough?: No Shortness of breath?: No  Endocrine Excessive thirst?: No  Musculoskeletal Back pain?: No Joint pain?: No  Neurological Headaches?: No Dizziness?: No  Psychologic Depression?: No Anxiety?: No  Physical Exam: BP 124/80   Pulse 88   Ht 5\' 8"  (1.727 m)   Wt 222 lb 3.2 oz (100.8 kg)   BMI 33.79 kg/m   Constitutional:  Alert and oriented, No acute distress. HEENT: [place] AT, moist mucus membranes.  Trachea midline, no masses. Cardiovascular: No clubbing, cyanosis, or edema. Respiratory: Normal respiratory effort,  no increased work of breathing. GI: Abdomen is soft, nontender, nondistended, no abdominal masses GU: No CVA tenderness.  Skin: No rashes, bruises or suspicious lesions. Lymph: No cervical or inguinal adenopathy. Neurologic: Grossly intact, no focal deficits, moving all 4 extremities. Psychiatric: Normal mood and affect.  Laboratory Data: Lab Results  Component Value Date   HGB 15.4 04/14/2014    Lab Results   Component Value Date   CREATININE 1.0 04/14/2014    Urinalysis Lab Results  Component Value Date   SPECGRAV 1.015 09/06/2017   PHUR 7.0 09/06/2017   COLORU Yellow 09/06/2017   APPEARANCEUR CLEAR (A) 09/08/2017   LEUKOCYTESUR NEGATIVE 09/08/2017   PROTEINUR NEGATIVE 09/08/2017   GLUCOSEU NEGATIVE 09/08/2017   KETONESU Negative 09/06/2017   RBCU 0-5 09/08/2017   BILIRUBINUR NEGATIVE 09/08/2017   UUROB 0.2 09/06/2017   NITRITE NEGATIVE 09/08/2017    Lab Results  Component Value Date   LABMICR See below: 09/06/2017   WBCUA 11-30 (A) 09/06/2017   RBCUA 0-2 09/06/2017   LABEPIT None seen 09/06/2017   MUCUS Present (A) 09/06/2017   BACTERIA NONE SEEN 09/08/2017     Assessment & Plan:    1. Benign prostatic hyperplasia with urinary retention Voiding trial was performed and he was not successful. His Foley catheter was replaced.  F/U 1 week for repeat voiding trial. He was interested in starting combination therapy and Rx finasteride was sent to his pharmacy.  Continue tamsulosin.    2. Elevated PSA Benign prostate biopsy.  Follow-up 6 months for PSA/DRE.    Joel Carter, [place] [address], [place], [place] [address] [phone]

## 2017-09-28 ENCOUNTER — Ambulatory Visit (INDEPENDENT_AMBULATORY_CARE_PROVIDER_SITE_OTHER): Payer: PPO | Admitting: Family Medicine

## 2017-09-28 VITALS — BP 103/68 | HR 93 | Ht 68.0 in | Wt 222.0 lb

## 2017-09-28 DIAGNOSIS — R339 Retention of urine, unspecified: Secondary | ICD-10-CM | POA: Diagnosis not present

## 2017-09-28 NOTE — Progress Notes (Signed)
Pt elected to just have foley removed and not a fill and pull. Foley removed. Pt tolerated well. Pt will RTC today by 3pm.   Blood pressure 103/68, pulse 93, height 5\' 8"  (1.727 m), weight 222 lb (100.7 kg).     Simple Catheter Placement  Due to urinary retention patient is returned today for a foley cath placement.  Patient was cleaned and prepped in a sterile fashion with betadine and lidocaine jelly 2% was instilled into the urethra.  A 16 FR foley catheter was inserted, urine return was noted  470ml, urine was yellow in color.  The balloon was filled with 10cc of sterile water.  A leg bag was attached for drainage. Patient was also given a night bag to take home and was given instruction on how to change from one bag to another.  Patient was given instruction on proper catheter care.  Patient tolerated well, no complications were noted   Preformed by: C. Corinna Capra, CMA and A. Perry Mount, Therapist, sports

## 2017-10-03 ENCOUNTER — Ambulatory Visit: Payer: PPO

## 2017-10-05 ENCOUNTER — Ambulatory Visit: Payer: PPO

## 2017-10-10 ENCOUNTER — Ambulatory Visit (INDEPENDENT_AMBULATORY_CARE_PROVIDER_SITE_OTHER): Payer: PPO

## 2017-10-10 VITALS — BP 102/73 | HR 85 | Ht 68.0 in | Wt 222.0 lb

## 2017-10-10 DIAGNOSIS — R339 Retention of urine, unspecified: Secondary | ICD-10-CM | POA: Diagnosis not present

## 2017-10-10 NOTE — Progress Notes (Signed)
Pt presented today for voiding trial. Pt again did not want to have a fill and pull. Therefore foley was removed and pt will RTC this afternoon by 3.  Blood pressure 102/73, pulse 85, height 5\' 8"  (1.727 m), weight 222 lb (100.7 kg).  PVR:400. Pt was able to urinate small amounts prior to CIC teaching.  Continuous Intermittent Catheterization  Due to urinary retention patient is present today for a teaching of self I & O Catheterization. Patient was given detailed verbal and printed instructions of self catheterization. Patient was cleaned and prepped in a sterile fashion.  With instruction and assistance patient inserted a 14FR and urine return was noted 300 ml, urine was clear in color. Patient tolerated well, no complications were noted Patient was given a sample bag with supplies to take home.  Instructions were given per Dr. Bernardo Heater for patient to cath 3-4 times daily.  An order was placed with [facility] for catheters to be sent to the patient's home. Patient is to follow up 1 month.  Preformed by: Toniann Fail, LPN   Additional Notes: Pt made f/u appt at check out.

## 2017-10-11 DIAGNOSIS — R339 Retention of urine, unspecified: Secondary | ICD-10-CM | POA: Diagnosis not present

## 2017-10-12 ENCOUNTER — Telehealth: Payer: Self-pay | Admitting: Urology

## 2017-10-12 NOTE — Telephone Encounter (Signed)
Spoke with pt in reference to CIC and voiding amounts. Reinforced with pt continue CIC and recording volumes. Pt voiced understanding.

## 2017-10-12 NOTE — Telephone Encounter (Signed)
Pt called with some information for you concerning his bladder symptoms.  Please give pt a call [phone].

## 2017-10-25 ENCOUNTER — Telehealth: Payer: Self-pay

## 2017-10-25 NOTE — Telephone Encounter (Signed)
Pt has indefinite retention and requires a coude catheter due to inability to pass a straight cath.

## 2017-11-13 ENCOUNTER — Telehealth: Payer: Self-pay | Admitting: Urology

## 2017-11-13 ENCOUNTER — Ambulatory Visit: Payer: PPO | Admitting: Urology

## 2017-11-13 ENCOUNTER — Other Ambulatory Visit: Payer: Self-pay

## 2017-11-13 VITALS — BP 147/93 | HR 80 | Ht 68.0 in | Wt 230.9 lb

## 2017-11-13 DIAGNOSIS — R972 Elevated prostate specific antigen [PSA]: Secondary | ICD-10-CM | POA: Diagnosis not present

## 2017-11-13 DIAGNOSIS — N401 Enlarged prostate with lower urinary tract symptoms: Secondary | ICD-10-CM

## 2017-11-13 DIAGNOSIS — N138 Other obstructive and reflux uropathy: Secondary | ICD-10-CM | POA: Diagnosis not present

## 2017-11-13 LAB — BLADDER SCAN AMB NON-IMAGING

## 2017-11-13 NOTE — Telephone Encounter (Signed)
Pt would like to not receive any more catheters.  He's keeping 20 for himself and giving [place] 90 back.  He said he doesn't need them anymore.  He would like for you to give him a call at your convenience.  [phone]

## 2017-11-14 ENCOUNTER — Encounter: Payer: Self-pay | Admitting: Urology

## 2017-11-14 NOTE — Progress Notes (Signed)
11/13/2017 7:20 AM   [name] 1941/10/04 [id number]  Referring provider: [name], MD [facility], [place] [address]  Chief Complaint  Patient presents with  . Follow-up    1 month    HPI: 77 year old male who underwent prostate biopsy on 08/29/2017 for a PSA of 5.56 with benign pathology.  His prostate volume was 75 g.  He noted worsening voiding symptoms after his biopsy and was seen in the ED on 11/17 with a UTI and urinary retention.  He had been on tamsulosin and was also started on finasteride.  A follow-up urine culture was negative.  He failed 2 voiding trials and was started on intermittent catheterization.  He began to void spontaneously in late December and is no longer catheterizing.  He feels he is back to baseline.  He does have some frequency and urgency.   PMH: Past Medical History:  Diagnosis Date  . BPH without obstruction/lower urinary tract symptoms     Surgical History: Past Surgical History:  Procedure Laterality Date  . COLONOSCOPY  2011   tubular adenoma  . COLONOSCOPY WITH PROPOFOL N/A 08/07/2017   Procedure: COLONOSCOPY WITH PROPOFOL;  Surgeon: Lollie Sails, MD;  Location[facility] ENDOSCOPY;  Service: Endoscopy;  Laterality: N/A;  . ESOPHAGOGASTRODUODENOSCOPY    . TONSILLECTOMY      Home Medications:  Allergies as of 11/13/2017   No Known Allergies     Medication List        Accurate as of 11/13/17 11:59 PM. Always use your most recent med list.          finasteride 5 MG tablet Commonly known as:  PROSCAR Take 1 tablet (5 mg total) by mouth daily.   multivitamin capsule Take 1 capsule by mouth daily.   tamsulosin 0.4 MG Caps capsule Commonly known as:  FLOMAX Take 0.4 mg by mouth daily.       Allergies: No Known Allergies  Family History: Family History  Problem Relation Age of Onset  . Diabetes Unknown        multiple family members  . Dementia Mother      Social History:  reports that  has never smoked. he has never used smokeless tobacco. He reports that he drinks about 2.4 oz of alcohol per week. He reports that he does not use drugs.  ROS: UROLOGY Frequent Urination?: Yes Hard to postpone urination?: Yes Burning/pain with urination?: No Get up at night to urinate?: No Leakage of urine?: No Urine stream starts and stops?: No Trouble starting stream?: No Do you have to strain to urinate?: No Blood in urine?: No Urinary tract infection?: No Sexually transmitted disease?: No Injury to kidneys or bladder?: No Painful intercourse?: No Weak stream?: No Erection problems?: No Penile pain?: No  Gastrointestinal Nausea?: No Vomiting?: No Indigestion/heartburn?: No Diarrhea?: No Constipation?: No  Constitutional Fever: No Night sweats?: No Weight loss?: No Fatigue?: No  Skin Skin rash/lesions?: No Itching?: No  Eyes Blurred vision?: No Double vision?: No  Ears/Nose/Throat Sore throat?: No Sinus problems?: No  Hematologic/Lymphatic Swollen glands?: No Easy bruising?: No  Cardiovascular Leg swelling?: No Chest pain?: No  Respiratory Cough?: No Shortness of breath?: No  Endocrine Excessive thirst?: No  Musculoskeletal Back pain?: No Joint pain?: No  Neurological Headaches?: No Dizziness?: No  Psychologic Depression?: No Anxiety?: No  Physical Exam: BP (!) 147/93   Pulse 80   Ht 5\' 8"  (1.727 m)   Wt 230 lb  14.4 oz (104.7 kg)   BMI 35.11 kg/m   Constitutional:  Alert and oriented, No acute distress. HEENT: [place] AT, moist mucus membranes.  Trachea midline, no masses. Cardiovascular: No clubbing, cyanosis, or edema. Respiratory: Normal respiratory effort, no increased work of breathing. GI: Abdomen is soft, nontender, nondistended, no abdominal masses GU: No CVA tenderness.  Skin: No rashes, bruises or suspicious lesions. Lymph: No cervical or inguinal adenopathy. Neurologic: Grossly intact,  no focal deficits, moving all 4 extremities. Psychiatric: Normal mood and affect.  Laboratory Data: Lab Results  Component Value Date   HGB 15.4 04/14/2014    Lab Results  Component Value Date   CREATININE 1.0 04/14/2014     Assessment & Plan:   Status post prostate biopsy with UTI and urinary retention.  His infection has resolved and he is currently back at baseline.  PVR by bladder scan today was 6 mL.  Recommend continuing tamsulosin and finasteride.  Follow-up May 2019 for PSA/DRE.  - Bladder Scan (Post Void Residual) in office   Abbie Sons, MD  [place] [address] [place], [place] [address] [phone]

## 2017-11-14 NOTE — Telephone Encounter (Signed)
D/c orders sent to [facility]

## 2018-02-01 DIAGNOSIS — Z0000 Encounter for general adult medical examination without abnormal findings: Secondary | ICD-10-CM | POA: Diagnosis not present

## 2018-02-01 DIAGNOSIS — Z79899 Other long term (current) drug therapy: Secondary | ICD-10-CM | POA: Diagnosis not present

## 2018-02-01 DIAGNOSIS — Z125 Encounter for screening for malignant neoplasm of prostate: Secondary | ICD-10-CM | POA: Diagnosis not present

## 2018-03-04 ENCOUNTER — Encounter: Payer: Self-pay | Admitting: Urology

## 2018-03-04 ENCOUNTER — Other Ambulatory Visit: Payer: Self-pay

## 2018-03-04 ENCOUNTER — Other Ambulatory Visit: Payer: Self-pay | Admitting: Family Medicine

## 2018-03-04 ENCOUNTER — Other Ambulatory Visit
Admission: RE | Admit: 2018-03-04 | Discharge: 2018-03-04 | Disposition: A | Discharge status: Home / Self Care | Payer: PPO | Source: Ambulatory Visit | Attending: Urology | Admitting: Urology

## 2018-03-04 DIAGNOSIS — R972 Elevated prostate specific antigen [PSA]: Secondary | ICD-10-CM | POA: Diagnosis not present

## 2018-03-04 LAB — PSA: Prostatic Specific Antigen: 2.15 ng/mL (ref 0.00–4.00)

## 2018-03-06 ENCOUNTER — Encounter: Payer: Self-pay | Admitting: Urology

## 2018-03-06 ENCOUNTER — Ambulatory Visit: Payer: PPO | Admitting: Urology

## 2018-03-06 VITALS — BP 108/67 | HR 72 | Ht 68.0 in | Wt 235.1 lb

## 2018-03-06 DIAGNOSIS — N138 Other obstructive and reflux uropathy: Secondary | ICD-10-CM | POA: Diagnosis not present

## 2018-03-06 DIAGNOSIS — R972 Elevated prostate specific antigen [PSA]: Secondary | ICD-10-CM

## 2018-03-06 DIAGNOSIS — N401 Enlarged prostate with lower urinary tract symptoms: Secondary | ICD-10-CM | POA: Diagnosis not present

## 2018-03-06 NOTE — Progress Notes (Signed)
03/06/2018 12:19 PM   [name] 07/19/1941 [id number]  Referring provider: [name], MD [facility] [place], [place] [address]  Chief Complaint  Patient presents with  . Follow-up   Urologic problem list: -Elevated PSA; biopsy 08/2017 for PSA of 5.56 with benign pathology  -BPH with lower urinary tract symptoms; on combination therapy  HPI: 77 year old male presents for follow-up of the above problems.  He had worsening lower urinary tract symptoms with UTI and urinary retention post biopsy.  He failed 2 voiding trials and was started on intermittent catheterization and approximately 6 weeks after his biopsy began to void spontaneously.  He was started on finasteride post biopsy and remains on tamsulosin.  He states he is voiding well and has no complaints.  He does note slight fatigue since starting finasteride.  A follow-up PSA performed earlier this week was 2.15   PMH: Past Medical History:  Diagnosis Date  . BPH without obstruction/lower urinary tract symptoms     Surgical History: Past Surgical History:  Procedure Laterality Date  . COLONOSCOPY  2011   tubular adenoma  . COLONOSCOPY WITH PROPOFOL N/A 08/07/2017   Procedure: COLONOSCOPY WITH PROPOFOL;  Surgeon: Lollie Sails, MD;  Location: [facility] ENDOSCOPY;  Service: Endoscopy;  Laterality: N/A;  . ESOPHAGOGASTRODUODENOSCOPY    . TONSILLECTOMY      Home Medications:  Allergies as of 03/06/2018   No Known Allergies     Medication List        Accurate as of 03/06/18 12:19 PM. Always use your most recent med list.          finasteride 5 MG tablet Commonly known as:  PROSCAR Take 1 tablet (5 mg total) by mouth daily.   multivitamin capsule Take 1 capsule by mouth daily.   tamsulosin 0.4 MG Caps capsule Commonly known as:  FLOMAX Take 0.4 mg by mouth daily.       Allergies: No Known Allergies  Family History: Family History  Problem  Relation Age of Onset  . Diabetes Unknown        multiple family members  . Dementia Mother     Social History:  reports that he has never smoked. He has never used smokeless tobacco. He reports that he drinks about 2.4 oz of alcohol per week. He reports that he does not use drugs.  ROS: UROLOGY Frequent Urination?: No Hard to postpone urination?: No Burning/pain with urination?: No Get up at night to urinate?: No Leakage of urine?: No Urine stream starts and stops?: No Trouble starting stream?: No Do you have to strain to urinate?: No Blood in urine?: No Urinary tract infection?: No Sexually transmitted disease?: No Injury to kidneys or bladder?: No Painful intercourse?: No Weak stream?: No Erection problems?: No Penile pain?: No  Gastrointestinal Nausea?: No Vomiting?: No Indigestion/heartburn?: No Diarrhea?: No Constipation?: No  Constitutional Fever: No Night sweats?: No Weight loss?: No Fatigue?: No  Skin Skin rash/lesions?: No Itching?: No  Eyes Blurred vision?: No Double vision?: No  Ears/Nose/Throat Sore throat?: No Sinus problems?: No  Hematologic/Lymphatic Swollen glands?: No Easy bruising?: No  Cardiovascular Leg swelling?: No Chest pain?: No  Respiratory Cough?: No Shortness of breath?: No  Endocrine Excessive thirst?: No  Musculoskeletal Back pain?: No Joint pain?: No  Neurological Headaches?: No Dizziness?: No  Psychologic Depression?: No Anxiety?: No  Physical Exam: BP 108/67 (BP Location: Right Arm, Patient Position: Sitting, Cuff Size: Large)   Pulse 72  Ht 5\' 8"  (1.727 m)   Wt 235 lb 1.6 oz (106.6 kg)   BMI 35.75 kg/m   Constitutional:  Alert and oriented, No acute distress. HEENT: [place] AT, moist mucus membranes.  Trachea midline, no masses. Cardiovascular: No clubbing, cyanosis, or edema. Respiratory: Normal respiratory effort, no increased work of breathing. GI: Abdomen is soft, nontender, nondistended, no  abdominal masses GU: No CVA tenderness.  Prostate 50 g, smooth without nodules Lymph: No cervical or inguinal lymphadenopathy. Skin: No rashes, bruises or suspicious lesions. Neurologic: Grossly intact, no focal deficits, moving all 4 extremities. Psychiatric: Normal mood and affect.   Assessment & Plan:   77 year old male with an elevated PSA and BPH with LUTS.  Significant improvement in his voiding pattern on combination therapy.  His PSA has decreased appropriately.  Recommend a follow-up PSA in 6 months and office visit/PSA in 1 year.   Return in about 1 year (around 03/07/2019) for Recheck, PSA.  Abbie Sons, [place] [address] [place], [place] [address] [phone]

## 2018-03-21 ENCOUNTER — Ambulatory Visit: Payer: PPO | Admitting: Urology

## 2018-08-30 ENCOUNTER — Other Ambulatory Visit: Payer: Self-pay

## 2018-08-30 DIAGNOSIS — N138 Other obstructive and reflux uropathy: Secondary | ICD-10-CM

## 2018-08-30 DIAGNOSIS — R972 Elevated prostate specific antigen [PSA]: Secondary | ICD-10-CM

## 2018-08-30 DIAGNOSIS — N401 Enlarged prostate with lower urinary tract symptoms: Secondary | ICD-10-CM

## 2018-08-30 MED ORDER — FINASTERIDE 5 MG PO TABS: 5.0000 mg | ORAL_TABLET | Freq: Every day | ORAL | 3 refills | Status: DC

## 2018-09-03 ENCOUNTER — Other Ambulatory Visit: Payer: Self-pay | Admitting: Family Medicine

## 2018-09-03 DIAGNOSIS — R972 Elevated prostate specific antigen [PSA]: Secondary | ICD-10-CM

## 2018-09-04 ENCOUNTER — Other Ambulatory Visit: Payer: PPO

## 2018-09-04 DIAGNOSIS — R972 Elevated prostate specific antigen [PSA]: Secondary | ICD-10-CM

## 2018-09-05 LAB — PSA: PROSTATE SPECIFIC AG, SERUM: 2.2 ng/mL (ref 0.0–4.0)

## 2018-09-06 ENCOUNTER — Telehealth: Payer: Self-pay | Admitting: Family Medicine

## 2018-09-06 NOTE — Telephone Encounter (Signed)
-----   Message from Abbie Sons, MD sent at 09/06/2018  7:52 AM EST ----- PSA stable at 2.2.  Follow-up as scheduled

## 2018-09-06 NOTE — Telephone Encounter (Signed)
LMOM informed of stable PSA.

## 2018-09-17 DIAGNOSIS — L718 Other rosacea: Secondary | ICD-10-CM | POA: Diagnosis not present

## 2018-09-17 DIAGNOSIS — D225 Melanocytic nevi of trunk: Secondary | ICD-10-CM | POA: Diagnosis not present

## 2018-09-17 DIAGNOSIS — D2262 Melanocytic nevi of left upper limb, including shoulder: Secondary | ICD-10-CM | POA: Diagnosis not present

## 2018-09-17 DIAGNOSIS — D3617 Benign neoplasm of peripheral nerves and autonomic nervous system of trunk, unspecified: Secondary | ICD-10-CM | POA: Diagnosis not present

## 2018-09-17 DIAGNOSIS — D485 Neoplasm of uncertain behavior of skin: Secondary | ICD-10-CM | POA: Diagnosis not present

## 2018-09-17 DIAGNOSIS — D2261 Melanocytic nevi of right upper limb, including shoulder: Secondary | ICD-10-CM | POA: Diagnosis not present

## 2018-09-17 DIAGNOSIS — R208 Other disturbances of skin sensation: Secondary | ICD-10-CM | POA: Diagnosis not present

## 2018-09-22 DIAGNOSIS — J019 Acute sinusitis, unspecified: Secondary | ICD-10-CM | POA: Diagnosis not present

## 2018-09-22 DIAGNOSIS — R05 Cough: Secondary | ICD-10-CM | POA: Diagnosis not present

## 2018-09-22 DIAGNOSIS — J069 Acute upper respiratory infection, unspecified: Secondary | ICD-10-CM | POA: Diagnosis not present

## 2018-09-23 ENCOUNTER — Other Ambulatory Visit: Payer: Self-pay

## 2018-09-23 NOTE — Patient Outreach (Signed)
[facility]) Care Management  09/23/2018  Joel Carter 1941/06/20 [id number]   TELEPHONE SCREENING Referral date: 09/23/18 Referral source: nurse call line Referral reason: vomiting / coughing/ dizziness Insurance:  [employer] team advantage Attempt #1  Telephone call to patient regarding nurse call line referral. Unable to reach patient. HIPAA compliant voice message left with call back phone number.   PLAN: RNCM will attempt 2nd telephone call to patient within 4 business days. RNCM will send outreach letter.   [name] RN,BSN, [place] Telephonic  [phone]

## 2018-09-26 ENCOUNTER — Other Ambulatory Visit: Payer: Self-pay

## 2018-09-26 NOTE — Patient Outreach (Signed)
[facility]) Care Management  09/26/2018  Joel Carter [birth year] [id number]  TELEPHONE SCREENING Referral date: 09/23/18 Referral source: nurse call line Referral reason: vomiting / coughing/ dizziness Insurance:  Health team advantage Attempt #2  Telephone call to patient regarding nurse call line referral. Unable to reach patient. HIPAA compliant voice message left with call back phone number.   PLAN; RNCM will attempt 3rd telephone call to patient within 4 business days.   [name] RN,BSN,CCM [facility] Telephonic  [phone]

## 2018-09-26 NOTE — Patient Outreach (Signed)
[facility]) Care Management  09/26/2018  Joel Carter 1941-01-11 [id number]  TELEPHONE SCREENING Referral date:09/23/18 Referral source:nurse call line Referral reason:vomiting / coughing/ dizziness Insurance:Health team advantage  Voice mail message received from patient. RNCM returned call. Patient very hesitant to confirm HIPAA and very abrupt.  HIPAA verified with patient. Explained reason for call. Patient states. "I just had a bad cough."  He states he  Started coughing to hard and started wretch ing. He states he is doing fine now. Patient state, " I went to see my doctor and I'm doing fine."  Patient denies any further needs or concerns.   PLAN: RNCM will close patient due being assessed and having no further needs.    [name] RN,BSN,CCM [facility] Telephonic  [phone]

## 2018-09-30 ENCOUNTER — Ambulatory Visit: Payer: Self-pay

## 2019-02-04 DIAGNOSIS — E538 Deficiency of other specified B group vitamins: Secondary | ICD-10-CM | POA: Diagnosis not present

## 2019-02-04 DIAGNOSIS — Z125 Encounter for screening for malignant neoplasm of prostate: Secondary | ICD-10-CM | POA: Diagnosis not present

## 2019-02-04 DIAGNOSIS — Z79899 Other long term (current) drug therapy: Secondary | ICD-10-CM | POA: Diagnosis not present

## 2019-02-04 DIAGNOSIS — Z0000 Encounter for general adult medical examination without abnormal findings: Secondary | ICD-10-CM | POA: Diagnosis not present

## 2019-02-06 ENCOUNTER — Other Ambulatory Visit: Payer: Self-pay | Admitting: Urology

## 2019-02-06 DIAGNOSIS — Z87898 Personal history of other specified conditions: Secondary | ICD-10-CM

## 2019-02-26 ENCOUNTER — Other Ambulatory Visit: Payer: Self-pay

## 2019-02-26 ENCOUNTER — Other Ambulatory Visit: Payer: PPO

## 2019-02-26 DIAGNOSIS — Z87898 Personal history of other specified conditions: Secondary | ICD-10-CM

## 2019-02-27 LAB — PSA: Prostate Specific Ag, Serum: 1.7 ng/mL (ref 0.0–4.0)

## 2019-03-03 ENCOUNTER — Ambulatory Visit: Payer: PPO | Admitting: Urology

## 2019-03-06 ENCOUNTER — Other Ambulatory Visit: Payer: Self-pay

## 2019-03-06 ENCOUNTER — Telehealth (INDEPENDENT_AMBULATORY_CARE_PROVIDER_SITE_OTHER): Payer: PPO | Admitting: Urology

## 2019-03-06 DIAGNOSIS — N401 Enlarged prostate with lower urinary tract symptoms: Secondary | ICD-10-CM

## 2019-03-06 DIAGNOSIS — N138 Other obstructive and reflux uropathy: Secondary | ICD-10-CM

## 2019-03-06 DIAGNOSIS — R972 Elevated prostate specific antigen [PSA]: Secondary | ICD-10-CM

## 2019-03-06 MED ORDER — FINASTERIDE 5 MG PO TABS: 5.0000 mg | ORAL_TABLET | Freq: Every day | ORAL | 3 refills | Status: DC

## 2019-03-06 MED ORDER — TAMSULOSIN HCL 0.4 MG PO CAPS: 0.4000 mg | ORAL_CAPSULE | Freq: Every day | ORAL | 3 refills | Status: DC

## 2019-03-06 NOTE — Progress Notes (Signed)
Virtual Visit via Telephone Note  I connected with Joel Carter on 03/06/19 at  3:00 PM EDT by telephone and verified that I am speaking with the correct person using two identifiers.  Location: Patient: Home Provider: Home office   I discussed the limitations, risks, security and privacy concerns of performing an evaluation and management service by telephone and the availability of in person appointments. I also discussed with the patient that there may be a patient responsible charge related to this service. The patient expressed understanding and agreed to proceed.   History of Present Illness: 78 year old male presents for annual follow-up via telephone secondary to COVID-19 pandemic.  Urologic problem list: -Elevated PSA; biopsy 08/2017 for PSA of 5.56 with benign pathology -Urinary retention post biopsy necessitating short-term intermittent catheterization; started on finasteride -BPH with lower urinary tract symptoms; on combination therapy   Mr. Joel Carter remains on tamsulosin and finasteride.  Over the past 6 weeks he has noted intermittent urgency and voiding small amounts.  He thinks this may be related to soft drink consumption.  He has mild dysuria when the symptoms are present.  Denies gross hematuria or flank/abdominal/pelvic/scrotal pain.  AN uncorrected PSA performed on 02/26/2019 was 1.7.  Observations/Objective: N/A  Assessment and Plan: 78 year old male with a history of mild PSA elevation and benign biopsy.  Doing well on combination therapy.  He recently has had some increased urgency and if this persist will check a urinalysis.  We discussed potential dietary factors that may attribute to the symptoms.  If his story related symptoms are persistent we discussed further medical management.  Follow Up Instructions: -Urinalysis for persistent storage related symptoms -Follow-up 6 months for PSA/DRE   I discussed the assessment and treatment plan with the patient.  The patient was provided an opportunity to ask questions and all were answered. The patient agreed with the plan and demonstrated an understanding of the instructions.   The patient was advised to call back or seek an in-person evaluation if the symptoms worsen or if the condition fails to improve as anticipated.  I provided 15 minutes of non-face-to-face time during this encounter.   Abbie Sons, MD

## 2019-07-01 ENCOUNTER — Other Ambulatory Visit: Payer: Self-pay

## 2019-07-01 ENCOUNTER — Encounter: Payer: Self-pay | Admitting: Physician Assistant

## 2019-07-01 ENCOUNTER — Ambulatory Visit (INDEPENDENT_AMBULATORY_CARE_PROVIDER_SITE_OTHER): Payer: PPO | Admitting: Physician Assistant

## 2019-07-01 VITALS — BP 138/89 | HR 71 | Ht 68.0 in | Wt 231.0 lb

## 2019-07-01 DIAGNOSIS — N138 Other obstructive and reflux uropathy: Secondary | ICD-10-CM | POA: Diagnosis not present

## 2019-07-01 DIAGNOSIS — N3001 Acute cystitis with hematuria: Secondary | ICD-10-CM | POA: Diagnosis not present

## 2019-07-01 DIAGNOSIS — R300 Dysuria: Secondary | ICD-10-CM

## 2019-07-01 DIAGNOSIS — N401 Enlarged prostate with lower urinary tract symptoms: Secondary | ICD-10-CM | POA: Diagnosis not present

## 2019-07-01 LAB — BLADDER SCAN AMB NON-IMAGING: Scan Result: 36

## 2019-07-01 MED ORDER — NITROFURANTOIN MONOHYD MACRO 100 MG PO CAPS: 100.0000 mg | ORAL_CAPSULE | Freq: Two times a day (BID) | ORAL | 0 refills | Status: AC

## 2019-07-01 NOTE — Progress Notes (Signed)
07/01/2019 1:31 PM   [name] [birth year] [id number]  CC: Dysuria, urinary leakage  HPI: Joel Carter is a 78 y.o. male who presents today for evaluation of possible UTI. He is an established [facility] patient who last saw Dr. Bernardo Heater on 03/06/2019 for annual follow-up of elevated PSA and BPH with LUTS on finasteride and tamsulosin, also with a history of urinary retention following prostate biopsy.  He reports a 2-week history of dysuria, urgency, frequency, and urinary leakage. He denies fevers, chills, nausea, vomiting, and flank pain. He thinks he may have had an episode of gross hematuria last night. He has taken Advil at home with adequate symptom palliation.  He does not have a history of recurrent UTI.  In-office UA today positive for 1+ blood, trace protein, nitrites, and 2+ leukocyte esterase; urine microscopy with >30 WBCs/HPF and 11-30 RBCs/HPF. PVR 35mL.  PMH: Past Medical History:  Diagnosis Date  . BPH without obstruction/lower urinary tract symptoms     Surgical History: Past Surgical History:  Procedure Laterality Date  . COLONOSCOPY  2011   tubular adenoma  . COLONOSCOPY WITH PROPOFOL N/A 08/07/2017   Procedure: COLONOSCOPY WITH PROPOFOL;  Surgeon: Lollie Sails, MD;  Location: [facility] ENDOSCOPY;  Service: Endoscopy;  Laterality: N/A;  . ESOPHAGOGASTRODUODENOSCOPY    . TONSILLECTOMY      Home Medications:  Allergies as of 07/01/2019   No Known Allergies     Medication List       Accurate as of July 01, 2019 11:59 PM. If you have any questions, ask your nurse or doctor.        finasteride 5 MG tablet Commonly known as: PROSCAR Take 1 tablet (5 mg total) by mouth daily.   multivitamin capsule Take 1 capsule by mouth daily.   nitrofurantoin (macrocrystal-monohydrate) 100 MG capsule Commonly known as: MACROBID Take 1 capsule (100 mg total) by mouth every 12 (twelve) hours for 7 days. Started by: Debroah Loop, PA-C    tamsulosin 0.4 MG Caps capsule Commonly known as: FLOMAX Take 1 capsule (0.4 mg total) by mouth daily.       Allergies:  No Known Allergies  Family History: Family History  Problem Relation Age of Onset  . Diabetes Unknown        multiple family members  . Dementia Mother     Social History:   reports that he has never smoked. He has never used smokeless tobacco. He reports current alcohol use of about 4.0 standard drinks of alcohol per week. He reports that he does not use drugs.  ROS: UROLOGY Frequent Urination?: Yes Hard to postpone urination?: Yes Burning/pain with urination?: Yes Get up at night to urinate?: No Leakage of urine?: Yes Urine stream starts and stops?: No Trouble starting stream?: No Do you have to strain to urinate?: No Blood in urine?: No Urinary tract infection?: Yes Sexually transmitted disease?: No Injury to kidneys or bladder?: No Painful intercourse?: No Weak stream?: No Erection problems?: No Penile pain?: No  Gastrointestinal Nausea?: No Vomiting?: No Indigestion/heartburn?: No Diarrhea?: No Constipation?: No  Constitutional Fever: No Night sweats?: No Weight loss?: No Fatigue?: No  Skin Skin rash/lesions?: No Itching?: No  Eyes Blurred vision?: No Double vision?: No  Ears/Nose/Throat Sore throat?: No Sinus problems?: No  Hematologic/Lymphatic Swollen glands?: No Easy bruising?: No  Cardiovascular Leg swelling?: No Chest pain?: No  Respiratory Cough?: No Shortness of breath?: No  Endocrine Excessive thirst?: No  Musculoskeletal Back pain?: No Joint pain?: No  Neurological Headaches?: No Dizziness?: No  Psychologic Depression?: No Anxiety?: No  Physical Exam: BP 138/89 (BP Location: Left Arm, Patient Position: Sitting, Cuff Size: Normal)   Pulse 71   Ht 5\' 8"  (1.727 m)   Wt 231 lb (104.8 kg)   BMI 35.12 kg/m   Constitutional:  Alert and oriented, no acute distress, nontoxic appearing HEENT:  [place], AT Cardiovascular: No clubbing, cyanosis, or edema Respiratory: Normal respiratory effort, no increased work of breathing Skin: No rashes, bruises or suspicious lesions Neurologic: Grossly intact, no focal deficits, moving all 4 extremities Psychiatric: Normal mood and affect  Laboratory Data: Results for orders placed or performed in visit on 07/01/19  Microscopic Examination   URINE  Result Value Ref Range   WBC, UA >30 (A) 0 - 5 /hpf   RBC 11-30 (A) 0 - 2 /hpf   Epithelial Cells (non renal) 0-10 0 - 10 /hpf   Renal Epithel, UA 0-10 (A) None seen /hpf   Mucus, UA Present (A) Not Estab.   Bacteria, UA Few None seen/Few  Urinalysis, Complete  Result Value Ref Range   Specific Gravity, UA 1.020 1.005 - 1.030   pH, UA 7.5 5.0 - 7.5   Color, UA Yellow Yellow   Appearance Ur Cloudy (A) Clear   Leukocytes,UA 2+ (A) Negative   Protein,UA Trace (A) Negative/Trace   Glucose, UA Negative Negative   Ketones, UA Negative Negative   RBC, UA 1+ (A) Negative   Bilirubin, UA Negative Negative   Urobilinogen, Ur 0.2 0.2 - 1.0 mg/dL   Nitrite, UA Positive (A) Negative   Microscopic Examination See below:   BLADDER SCAN AMB NON-IMAGING  Result Value Ref Range   Scan Result 36    Assessment & Plan:   1. Acute cystitis with hematuria Patient with symptoms and UA consistent with acute cystitis.  PVR within normal limits, I am not concerned for urinary retention at this time.  Will treat for infection today.  Patient will need to return to clinic following treatment for repeat UA and microscopy to prove resolution of microscopic hematuria. -Urinalysis complete -Urine culture -PVR -Nitrofurantoin 100mg  BID x7 days -Repeat UA and microscopy following treatment  Debroah Loop, [facility]  [place] [address] [place], [place] [address] [phone]

## 2019-07-02 LAB — URINALYSIS, COMPLETE
Bilirubin, UA: NEGATIVE
Glucose, UA: NEGATIVE
Ketones, UA: NEGATIVE
Nitrite, UA: POSITIVE — AB
Specific Gravity, UA: 1.02 (ref 1.005–1.030)
Urobilinogen, Ur: 0.2 mg/dL (ref 0.2–1.0)
pH, UA: 7.5 (ref 5.0–7.5)

## 2019-07-02 LAB — MICROSCOPIC EXAMINATION: WBC, UA: >30 /hpf — AB (ref 0–5)

## 2019-07-04 LAB — CULTURE, URINE COMPREHENSIVE

## 2019-07-08 ENCOUNTER — Telehealth: Payer: Self-pay | Admitting: Physician Assistant

## 2019-07-08 ENCOUNTER — Other Ambulatory Visit: Payer: Self-pay

## 2019-07-08 ENCOUNTER — Other Ambulatory Visit: Payer: PPO

## 2019-07-08 ENCOUNTER — Other Ambulatory Visit: Payer: Self-pay | Admitting: Physician Assistant

## 2019-07-08 DIAGNOSIS — R300 Dysuria: Secondary | ICD-10-CM

## 2019-07-08 DIAGNOSIS — N3001 Acute cystitis with hematuria: Secondary | ICD-10-CM

## 2019-07-08 LAB — URINALYSIS, COMPLETE
Bilirubin, UA: NEGATIVE
Glucose, UA: NEGATIVE
Ketones, UA: NEGATIVE
Nitrite, UA: NEGATIVE
Specific Gravity, UA: 1.025 (ref 1.005–1.030)
Urobilinogen, Ur: 0.2 mg/dL (ref 0.2–1.0)
pH, UA: 6.5 (ref 5.0–7.5)

## 2019-07-08 LAB — MICROSCOPIC EXAMINATION: RBC: >30 /hpf — AB (ref 0–2)

## 2019-07-08 MED ORDER — SULFAMETHOXAZOLE-TRIMETHOPRIM 800-160 MG PO TABS: 1.0000 | ORAL_TABLET | Freq: Two times a day (BID) | ORAL | 0 refills | Status: AC

## 2019-07-08 NOTE — Telephone Encounter (Signed)
Called the patient to inform him of his UA dropoff results today. Left VM for return call.  UA with worsened microscopic hematuria since last week. I suspect continued infection. Bactrim rx sent to his pharmacy. Will advise him to start therapy ASAP, he will need to come back for another dropoff UA to again try to prove MH resolution after completing this.

## 2019-07-10 NOTE — Telephone Encounter (Signed)
I called the patient again today and this time was able to reach him directly.  I explained to him that he appears to continue to have a urinary tract infection that requires treatment.  I explained that I prescribed him a new antibiotic 2 days ago and that he should pick this up today and start treatment.  I explained that his microscopic hematuria has persisted and he will need to drop off another follow-up UA to prove resolution of hematuria once he completes this second round of antibiotics.  Patient reports gross hematuria this morning.  He states he is having severe bladder pain and bladder spasms.  He states that he got confused between his prostate symptoms and his bladder symptoms and he called the office yesterday and scheduled an appointment for tomorrow with Dr. Bernardo Heater.  I explained that I do not believe that he needs this appointment, as I suspect his symptoms can be explained by his untreated infection.  I canceled his appointment with Dr. Bernardo Heater for tomorrow and advised him to call back to the office if he does not feel better within 3 days of initiating this new antibiotic.  He expressed understanding of this plan.

## 2019-07-11 ENCOUNTER — Ambulatory Visit: Payer: PPO | Admitting: Urology

## 2019-07-21 ENCOUNTER — Other Ambulatory Visit: Payer: Self-pay

## 2019-07-21 ENCOUNTER — Telehealth: Payer: Self-pay | Admitting: Physician Assistant

## 2019-07-21 ENCOUNTER — Encounter: Payer: Self-pay | Admitting: Physician Assistant

## 2019-07-21 ENCOUNTER — Ambulatory Visit: Payer: PPO | Admitting: Physician Assistant

## 2019-07-21 VITALS — BP 118/83 | HR 98 | Ht 68.0 in | Wt 220.0 lb

## 2019-07-21 DIAGNOSIS — N3289 Other specified disorders of bladder: Secondary | ICD-10-CM

## 2019-07-21 DIAGNOSIS — R300 Dysuria: Secondary | ICD-10-CM

## 2019-07-21 LAB — BLADDER SCAN AMB NON-IMAGING: PVR: 0 WU

## 2019-07-21 MED ORDER — TROSPIUM CHLORIDE 20 MG PO TABS: 20.0000 mg | ORAL_TABLET | Freq: Two times a day (BID) | ORAL | 0 refills | Status: AC

## 2019-07-21 MED ORDER — SULFAMETHOXAZOLE-TRIMETHOPRIM 800-160 MG PO TABS: 1.0000 | ORAL_TABLET | Freq: Two times a day (BID) | ORAL | 0 refills | Status: AC

## 2019-07-21 NOTE — Telephone Encounter (Signed)
Pt called and states that he just finished up his 2 weeks of ABX and is still having symptoms of UTI. Please advise.

## 2019-07-21 NOTE — Progress Notes (Signed)
07/21/2019 1:07 PM   Joel Carter 04/09/1941 [id number]  CC: Dysuria  HPI: Joel Carter is a 78 y.o. male who presents today for evaluation of possible UTI. He is an established [facility] patient who last saw me on 07/01/2019 for dysuria with concern for UTI. He was found to have a grossly infected UA with microscopic hematuria at that time and treated with nitrofurantoin 100mg  BID x7 days. His symptoms persisted and repeat UA revealed worsened MH. He was then treated with Bactrim 800-160mg  BID x10 days. He returns to the office today with the same symptoms.  Urine culture on 07/01/2019 with Staph epidermidis; Epic error on 9/15 prevented ordered culture from being sent.  He reports some slight symptom improvement following his Bactrim course but reports severe bladder spasms and dysuria over the past several days. He has taken Advil at home with symptom palliation but reports his symptoms are worsened with physical activity.  He reports that sitting straight puts too much pressure on his bladder and causes spasms and discomfort.  He has started shifting his weight to the side to alleviate this.  He does not have a history of recurrent UTI. Urologic history significant for BPH with LUTS on dual therapy and history of elevated PSA. He has a history of acute urinary retention requiring CIC following a prostate biopsy in 2018; he has not had to catheterize himself since.  In-office UA today positive for 3+ blood, 3+ protein, and 1+ leukocyte esterase; urine microscopy with 11-30 WBCs/HPF and >30 RBCs/HPF. PVR 27mL.  PMH: Past Medical History:  Diagnosis Date  . BPH without obstruction/lower urinary tract symptoms    Surgical History: Past Surgical History:  Procedure Laterality Date  . COLONOSCOPY  2011   tubular adenoma  . COLONOSCOPY WITH PROPOFOL N/A 08/07/2017   Procedure: COLONOSCOPY WITH PROPOFOL;  Surgeon: Joel Sails, MD;  Location: [facility] ENDOSCOPY;  Service: Endoscopy;   Laterality: N/A;  . ESOPHAGOGASTRODUODENOSCOPY    . TONSILLECTOMY     Home Medications:  Allergies as of 07/21/2019   No Known Allergies     Medication List       Accurate as of July 21, 2019 11:59 PM. If you have any questions, ask your nurse or doctor.        finasteride 5 MG tablet Commonly known as: PROSCAR Take 1 tablet (5 mg total) by mouth daily.   multivitamin capsule Take 1 capsule by mouth daily.   sulfamethoxazole-trimethoprim 800-160 MG tablet Commonly known as: BACTRIM DS Take 1 tablet by mouth 2 (two) times daily for 14 days. Started by: Joel Loop, PA-C   tamsulosin 0.4 MG Caps capsule Commonly known as: FLOMAX Take 1 capsule (0.4 mg total) by mouth daily.   trospium 20 MG tablet Commonly known as: SANCTURA Take 1 tablet (20 mg total) by mouth 2 (two) times daily for 14 days. Started by: Joel Loop, PA-C       Allergies:  No Known Allergies  Family History: Family History  Problem Relation Age of Onset  . Diabetes Unknown        multiple family members  . Dementia Mother     Social History:   reports that he has never smoked. He has never used smokeless tobacco. He reports current alcohol use of about 4.0 standard drinks of alcohol per week. He reports that he does not use drugs.  ROS: UROLOGY Frequent Urination?: Yes Hard to postpone urination?: Yes Burning/pain with urination?: Yes Get up at night to  urinate?: Yes Leakage of urine?: Yes Urine stream starts and stops?: No Trouble starting stream?: No Do you have to strain to urinate?: No Blood in urine?: No Urinary tract infection?: Yes Sexually transmitted disease?: No Injury to kidneys or bladder?: No Painful intercourse?: No Weak stream?: No Erection problems?: No Penile pain?: No  Gastrointestinal Nausea?: No Vomiting?: No Indigestion/heartburn?: No Diarrhea?: No Constipation?: No  Constitutional Fever: No Night sweats?: No Weight loss?:  No Fatigue?: No  Skin Skin rash/lesions?: No Itching?: No  Eyes Blurred vision?: No Double vision?: No  Ears/Nose/Throat Sore throat?: No Sinus problems?: No  Hematologic/Lymphatic Swollen glands?: No Easy bruising?: No  Cardiovascular Leg swelling?: No Chest pain?: No  Respiratory Cough?: No Shortness of breath?: No  Endocrine Excessive thirst?: No  Musculoskeletal Back pain?: No Joint pain?: No  Neurological Headaches?: No Dizziness?: No  Psychologic Depression?: No Anxiety?: No  Physical Exam: BP 118/83   Pulse 98   Ht 5\' 8"  (1.727 m)   Wt 220 lb (99.8 kg)   BMI 33.45 kg/m   Constitutional:  Alert and oriented, uncomfortable appearing, leaning to the right in his chair, nontoxic appearing HEENT: [place], AT Cardiovascular: No clubbing, cyanosis, or edema Respiratory: Normal respiratory effort, no increased work of breathing GI: Abdomen is soft, nontender, nondistended, no abdominal masses Skin: No rashes, bruises or suspicious lesions Neurologic: Grossly intact, no focal deficits, moving all 4 extremities Psychiatric: Normal mood and affect  Laboratory Data: Results for orders placed or performed in visit on 07/21/19  Microscopic Examination   URINE  Result Value Ref Range   WBC, UA 11-30 (A) 0 - 5 /hpf   RBC >30 (A) 0 - 2 /hpf   Epithelial Cells (non renal) 0-10 0 - 10 /hpf   Renal Epithel, UA 0-10 (A) None seen /hpf   Mucus, UA Present (A) Not Estab.   Bacteria, UA Few None seen/Few  Urinalysis, Complete  Result Value Ref Range   Specific Gravity, UA >1.030 (H) 1.005 - 1.030   pH, UA 5.5 5.0 - 7.5   Color, UA Yellow Yellow   Appearance Ur Cloudy (A) Clear   Leukocytes,UA 1+ (A) Negative   Protein,UA 3+ (A) Negative/Trace   Glucose, UA Negative Negative   Ketones, UA Trace (A) Negative   RBC, UA 3+ (A) Negative   Bilirubin, UA Negative Negative   Urobilinogen, Ur 0.2 0.2 - 1.0 mg/dL   Nitrite, UA Negative Negative   Microscopic  Examination See below:   BLADDER SCAN AMB NON-IMAGING  Result Value Ref Range   PVR 0.0 WU   Assessment & Plan:   1. Dysuria Patient remains with symptoms and UA concerning for cystitis with microscopic hematuria.  Will send for culture.  Will initiate treatment today with a 14-day course of Bactrim DS twice daily.  I counseled patient that I would be in touch with the results of his urine culture either way later this week.  Based on these results, will need to determine plan for follow-up of MH.  He expressed understanding. - Urinalysis, Complete - CULTURE, URINE COMPREHENSIVE - sulfamethoxazole-trimethoprim (BACTRIM DS) 800-160 MG tablet; Take 1 tablet by mouth 2 (two) times daily for 14 days.  Dispense: 28 tablet; Refill: 0  2. Bladder spasm Patient reports significant bladder spasms today.  PVR 0, I am not concerned for urinary retention at this time.  Additionally, he reports that he continues to take tamsulosin and finasteride for his BPH.  He does report some symptom palliation with ibuprofen.  Will prescribe a two-week course of trospium for symptom palliation in light of his recent presentation.  I advised him to also continue with ibuprofen as needed.  He expressed understanding. - BLADDER SCAN AMB NON-IMAGING - trospium (SANCTURA) 20 MG tablet; Take 1 tablet (20 mg total) by mouth 2 (two) times daily for 14 days.  Dispense: 28 tablet; Refill: 0  Joel Loop, PA-C  [place] [address], [place], [place] [address] [phone]

## 2019-07-22 LAB — URINALYSIS, COMPLETE
Bilirubin, UA: NEGATIVE
Glucose, UA: NEGATIVE
Nitrite, UA: NEGATIVE
Specific Gravity, UA: >1.03 — ABNORMAL HIGH (ref 1.005–1.030)
Urobilinogen, Ur: 0.2 mg/dL (ref 0.2–1.0)
pH, UA: 5.5 (ref 5.0–7.5)

## 2019-07-22 LAB — MICROSCOPIC EXAMINATION: RBC: >30 /hpf — AB (ref 0–2)

## 2019-07-23 LAB — CULTURE, URINE COMPREHENSIVE

## 2019-07-24 ENCOUNTER — Telehealth: Payer: Self-pay | Admitting: Physician Assistant

## 2019-07-24 ENCOUNTER — Other Ambulatory Visit: Payer: Self-pay | Admitting: Physician Assistant

## 2019-07-24 DIAGNOSIS — R3129 Other microscopic hematuria: Secondary | ICD-10-CM

## 2019-07-24 NOTE — Telephone Encounter (Signed)
I just spoke with the patient via telephone to inform him and that his most recent urine culture was negative.  I explained that his urinary tract infection has been appropriately treated and he should stop his prescribed antibiotics at this time.  He expressed understanding and reported that his bladder spasms have been helped with the prescribed trospium.  I informed him that he should continue this if he finds it helpful.  I explained that given the persistence of his microscopic hematuria, further work-up is indicated to determine the source of the bleeding.  Patient does have a PMH BPH.  He is not on anticoagulants.  CT hematuria ordered today, I will have him follow-up in clinic afterward with Dr. Bernardo Heater with cystoscopy and CT results.

## 2019-07-24 NOTE — Telephone Encounter (Signed)
I have ordered a hematuria work-up for this patient.  CT order already in.  Please call him to schedule follow-up with Dr. Bernardo Heater for cystoscopy and CT results.

## 2019-07-25 NOTE — Telephone Encounter (Signed)
I just spoke with the patient via telephone.  He reports an episode of gross hematuria.  He denies passing clots.  He states his urine was thin and runny and not thick like catch-up.  He states the antispasmodic medication that I prescribed him continues to help.  He denies dizziness, lightheadedness, and rapid heart rate.  I counseled him that he should keep his scheduled appointments with [place] for hematuria work-up.  I counseled him to proceed to the emergency department over the weekend if he becomes unable to urinate, if his urine becomes thick and bloody, or if he becomes dizzy, lightheaded, or tachycardic.  He expressed understanding.

## 2019-07-25 NOTE — Telephone Encounter (Signed)
Pt just called and states that he just went to the bathroom and he passed a huge amount of blood. He would like a call back.

## 2019-07-25 NOTE — Telephone Encounter (Signed)
Appt made for Cysto and CT results, pt is calling scheduling to schedule CT.

## 2019-08-01 ENCOUNTER — Other Ambulatory Visit: Payer: Self-pay

## 2019-08-01 ENCOUNTER — Ambulatory Visit
Admission: RE | Admit: 2019-08-01 | Discharge: 2019-08-01 | Disposition: A | Discharge status: Home / Self Care | Payer: PPO | Source: Ambulatory Visit | Attending: Physician Assistant | Admitting: Physician Assistant

## 2019-08-01 DIAGNOSIS — R3129 Other microscopic hematuria: Secondary | ICD-10-CM | POA: Insufficient documentation

## 2019-08-01 DIAGNOSIS — N210 Calculus in bladder: Secondary | ICD-10-CM | POA: Diagnosis not present

## 2019-08-01 LAB — POCT I-STAT CREATININE: Creatinine, Ser: 0.9 mg/dL (ref 0.61–1.24)

## 2019-08-01 MED ORDER — IOHEXOL 300 MG/ML  SOLN
125.0000 mL | Freq: Once | INTRAMUSCULAR | Status: AC | PRN
  Administered 2019-08-01: 125 mL via INTRAVENOUS

## 2019-08-07 ENCOUNTER — Ambulatory Visit: Payer: PPO | Admitting: Urology

## 2019-08-07 ENCOUNTER — Encounter: Payer: Self-pay | Admitting: Urology

## 2019-08-07 ENCOUNTER — Telehealth: Payer: Self-pay | Admitting: Urology

## 2019-08-07 ENCOUNTER — Other Ambulatory Visit: Payer: Self-pay

## 2019-08-07 VITALS — BP 152/86 | HR 80 | Ht 68.0 in | Wt 220.0 lb

## 2019-08-07 DIAGNOSIS — N401 Enlarged prostate with lower urinary tract symptoms: Secondary | ICD-10-CM | POA: Diagnosis not present

## 2019-08-07 DIAGNOSIS — R3129 Other microscopic hematuria: Secondary | ICD-10-CM

## 2019-08-07 DIAGNOSIS — N138 Other obstructive and reflux uropathy: Secondary | ICD-10-CM | POA: Diagnosis not present

## 2019-08-07 DIAGNOSIS — N210 Calculus in bladder: Secondary | ICD-10-CM

## 2019-08-07 LAB — MICROSCOPIC EXAMINATION: RBC: >30 /hpf — AB (ref 0–2)

## 2019-08-07 LAB — URINALYSIS, COMPLETE
Bilirubin, UA: NEGATIVE
Ketones, UA: NEGATIVE
Nitrite, UA: NEGATIVE
Specific Gravity, UA: >1.03 — ABNORMAL HIGH (ref 1.005–1.030)
Urobilinogen, Ur: 0.2 mg/dL (ref 0.2–1.0)
pH, UA: 6 (ref 5.0–7.5)

## 2019-08-07 MED ORDER — TROSPIUM CHLORIDE 20 MG PO TABS: 20.0000 mg | ORAL_TABLET | Freq: Two times a day (BID) | ORAL | 3 refills | Status: DC

## 2019-08-07 MED ORDER — LIDOCAINE HCL URETHRAL/MUCOSAL 2 % EX GEL
1.0000 "application " | Freq: Once | CUTANEOUS | Status: AC
  Administered 2019-08-07: 1 via URETHRAL

## 2019-08-07 NOTE — Telephone Encounter (Signed)
Pt called and was asking about an RX for spasms that was supposed to be called into the pharmacy.

## 2019-08-07 NOTE — Telephone Encounter (Signed)
RX sent

## 2019-08-10 NOTE — Progress Notes (Signed)
   08/11/19  CC:  Chief Complaint  Patient presents with  . Cysto    HPI: 78 y.o. male recently seen by Debroah Loop with UTI, storage related voiding symptoms and hematuria.  Symptoms improved on trospium.  CTU performed 08/01/2019 showed no significant upper tract abnormalities.  There were multiple bladder calculi present several measuring 10 to 15 mm.  Prostate volume ~ 66cc  Blood pressure (!) 152/86, pulse 80, height 5\' 8"  (1.727 m), weight 220 lb (99.8 kg). NED. A&Ox3.   No respiratory distress   Abd soft, NT, ND Normal phallus with bilateral descended testicles  Cystoscopy Procedure Note  Patient identification was confirmed, informed consent was obtained, and patient was prepped using Betadine solution.  Lidocaine jelly was administered per urethral meatus.     Pre-Procedure: - Inspection reveals a normal caliber ureteral meatus.  Procedure: The flexible cystoscope was introduced without difficulty - No urethral strictures/lesions are present. - Lateral lobe enlargement prostate  - Mild to moderate elevation bladder neck - Bilateral ureteral orifices identified - Bladder mucosa  reveals no ulcers, tumors, or lesions - Multiple bladder calculi present - No trabeculation  Retroflexion shows multiple bladder calculi and no intravesical median lobe   Post-Procedure: - Patient tolerated the procedure well  Assessment/ Plan: Hematuria and voiding symptoms secondary to multiple bladder calculi.  I recommended scheduling cystolitholapaxy and a bladder outlet procedure.  Based on prostate size he would be candidate for TURP and minimally invasive options including UroLift.  He was particular interested in [place].  The procedure was discussed in detail including potential risks of bleeding, infection, urethral stricture, persistent voiding symptoms.  The low incidence of sexual side effects including retrograde ejaculation was discussed.  Calculi density measures  >1500 HU and he may need a staged procedure.  He indicated all questions were answered and desires to proceed.   Abbie Sons, MD

## 2019-08-11 ENCOUNTER — Encounter: Payer: Self-pay | Admitting: Urology

## 2019-08-11 NOTE — Progress Notes (Signed)
08/07/2019 8:22 AM   Joel Carter 1941-08-09 [id number]  Referring provider: [name], MD [place] [facility] [place],  [place] [address]  Chief Complaint  Patient presents with   Cysto    HPI: 78 y.o. male recently seen for UTI, storage related voiding symptoms and hematuria.  CTU showed multiple bladder calculi.  Cystoscopy with moderate prostate enlargement.  He presents for cystolitholapaxy and UroLift.   PMH: Past Medical History:  Diagnosis Date   BPH without obstruction/lower urinary tract symptoms     Surgical History: Past Surgical History:  Procedure Laterality Date   COLONOSCOPY  2011   tubular adenoma   COLONOSCOPY WITH PROPOFOL N/A 08/07/2017   Procedure: COLONOSCOPY WITH PROPOFOL;  Surgeon: Lollie Sails, MD;  Location: [facility] ENDOSCOPY;  Service: Endoscopy;  Laterality: N/A;   ESOPHAGOGASTRODUODENOSCOPY     TONSILLECTOMY      Home Medications:  Allergies as of 08/07/2019   No Known Allergies     Medication List       Accurate as of August 07, 2019 11:59 PM. If you have any questions, ask your nurse or doctor.        finasteride 5 MG tablet Commonly known as: PROSCAR Take 1 tablet (5 mg total) by mouth daily.   multivitamin capsule Take 1 capsule by mouth daily.   tamsulosin 0.4 MG Caps capsule Commonly known as: FLOMAX Take 1 capsule (0.4 mg total) by mouth daily.   trospium 20 MG tablet Commonly known as: SANCTURA Take 1 tablet (20 mg total) by mouth 2 (two) times daily. Started by: Abbie Sons, MD       Allergies: No Known Allergies  Family History: Family History  Problem Relation Age of Onset   Diabetes Other        multiple family members   Dementia Mother     Social History:  reports that he has never smoked. He has never used smokeless tobacco. He reports current alcohol use of about 4.0 standard drinks of alcohol per week. He reports that he does not use  drugs.  ROS: No significant changes from 07/21/2019  Physical Exam: BP (!) 152/86 (BP Location: Left Arm, Patient Position: Sitting, Cuff Size: Normal)    Pulse 80    Ht 5\' 8"  (1.727 m)    Wt 220 lb (99.8 kg)    BMI 33.45 kg/m   Constitutional:  Alert and oriented, No acute distress. HEENT: [place] AT, moist mucus membranes.  Trachea midline, no masses. Cardiovascular: No clubbing, cyanosis, or edema.  RRR Respiratory: Normal respiratory effort, no increased work of breathing.  Clear GI: Abdomen is soft, nontender, nondistended, no abdominal masses GU: No CVA tenderness Lymph: No cervical or inguinal lymphadenopathy. Skin: No rashes, bruises or suspicious lesions. Neurologic: Grossly intact, no focal deficits, moving all 4 extremities. Psychiatric: Normal mood and affect.   Pertinent Imaging:  Images were personally reviewed  Results for orders placed in visit on 07/24/19  CT HEMATURIA WORKUP   Narrative CLINICAL DATA:  Microscopic hematuria. Polyuria. Episode of gross hematuria 2 weeks ago. Dysuria.  EXAM: CT ABDOMEN AND PELVIS WITHOUT AND WITH CONTRAST  TECHNIQUE: Multidetector CT imaging of the abdomen and pelvis was performed following the standard protocol before and following the bolus administration of intravenous contrast.  CONTRAST:  141mL OMNIPAQUE IOHEXOL 300 MG/ML  SOLN  COMPARISON:  None.  FINDINGS: Lower chest: Upper normal heart size.  Hepatobiliary: Diffuse hepatic steatosis. Gallbladder unremarkable.  Pancreas: Unremarkable  Spleen: Unremarkable  Adrenals/Urinary Tract: There are over 10 mobile stones in the urinary bladder measuring up to 1.7 cm in diameter. No renal or ureteral calculi identified.  Adrenal glands appear normal. Two small hypodense lesions in the right kidney technically too small to characterize although statistically likely to be cysts. Borderline wall thickening in the urinary bladder. No additional focal filling defects or  focal enhancement along the urothelium.  Stomach/Bowel: A 4.5 by 3.7 cm soft tissue density in the cecum near the ileocecal valve demonstrates no significant appreciable enhancement and accordingly most likely represents stool rather than a colon mass.  Vascular/Lymphatic: Aortoiliac atherosclerotic vascular disease. No adenopathy.  Reproductive: Moderate prostatomegaly.  Other: No supplemental non-categorized findings.  Musculoskeletal: Fused SI joints. Grade 1 degenerative anterolisthesis at L4-5. Lumbar spondylosis and degenerative disc disease resulting in bilateral foraminal impingement at L3-4 and L4-5, and right foraminal impingement at L5-S1.  IMPRESSION: 1. There are over 10 mobile stones in the urinary bladder measuring up to 1.7 cm in diameter. 2. Borderline wall thickening in the urinary bladder, query low-grade cystitis. 3. Moderate prostatomegaly. 4. Diffuse hepatic steatosis. 5. Lumbar spondylosis and degenerative disc disease causing impingement at L3-4 and L4-5, and right foraminal impingement at L5-S1.  Aortic Atherosclerosis (ICD10-I70.0).   Electronically Signed   By: Van Clines M.D.   On: 08/01/2019 16:51      Assessment & Plan:   78 y.o. male with multiple bladder calculi and bladder outlet obstruction.  He presents for cystolitholapaxy and UroLift.  The procedure has been discussed in detail as per cystoscopy note of this date.  Due to the number of calculi and stone density this may need to be a staged procedure.  Indicated all questions were answered and desires to proceed.   Abbie Sons, [place] [address], [place], [place] [address] [phone]

## 2019-08-15 ENCOUNTER — Other Ambulatory Visit: Payer: Self-pay | Admitting: Urology

## 2019-09-03 ENCOUNTER — Other Ambulatory Visit: Payer: Self-pay | Admitting: Radiology

## 2019-09-03 DIAGNOSIS — N138 Other obstructive and reflux uropathy: Secondary | ICD-10-CM

## 2019-09-03 DIAGNOSIS — N210 Calculus in bladder: Secondary | ICD-10-CM

## 2019-09-05 ENCOUNTER — Telehealth: Payer: Self-pay | Admitting: Urology

## 2019-09-05 ENCOUNTER — Other Ambulatory Visit: Payer: Self-pay

## 2019-09-05 ENCOUNTER — Other Ambulatory Visit
Admission: RE | Admit: 2019-09-05 | Discharge: 2019-09-05 | Disposition: A | Discharge status: Home / Self Care | Payer: PPO | Source: Ambulatory Visit | Attending: Urology | Admitting: Urology

## 2019-09-05 DIAGNOSIS — Z20828 Contact with and (suspected) exposure to other viral communicable diseases: Secondary | ICD-10-CM | POA: Diagnosis not present

## 2019-09-05 DIAGNOSIS — Z01818 Encounter for other preprocedural examination: Secondary | ICD-10-CM | POA: Insufficient documentation

## 2019-09-05 DIAGNOSIS — R54 Age-related physical debility: Secondary | ICD-10-CM | POA: Diagnosis not present

## 2019-09-05 HISTORY — DX: Gastro-esophageal reflux disease without esophagitis: K21.9

## 2019-09-05 NOTE — Patient Instructions (Signed)
Your procedure is scheduled on: 11/17/2020Tues Report to Same Day Surgery 2nd floor medical mall [facility] Entrance-take elevator on left to 2nd floor.  Check in with surgery information desk.) To find out your arrival time please call [phone] between 1PM - 3PM on 09/08/2019 Mon  Remember: Instructions that are not followed completely may result in serious medical risk, up to and including death, or upon the discretion of your surgeon and anesthesiologist your surgery may need to be rescheduled.    _x___ 1. Do not eat food after midnight the night before your procedure. You may drink clear liquids up to 2 hours before you are scheduled to arrive at the hospital for your procedure.  Do not drink clear liquids within 2 hours of your scheduled arrival to the hospital.  Clear liquids include  --Water or Apple juice without pulp  --Clear carbohydrate beverage such as ClearFast or Gatorade  --Black Coffee or Clear Tea (No milk, no creamers, do not add anything to                  the coffee or Tea Type 1 and type 2 diabetics should only drink water.   ____Ensure clear carbohydrate drink on the way to the hospital for bariatric patients  ____Ensure clear carbohydrate drink 3 hours before surgery.   No gum chewing or hard candies.     __x__ 2. No Alcohol for 24 hours before or after surgery.   __x__3. No Smoking or e-cigarettes for 24 prior to surgery.  Do not use any chewable tobacco products for at least 6 hour prior to surgery   ____  4. Bring all medications with you on the day of surgery if instructed.    __x__ 5. Notify your doctor if there is any change in your medical condition     (cold, fever, infections).    x___6. On the morning of surgery brush your teeth with toothpaste and water.  You may rinse your mouth with mouth wash if you wish.  Do not swallow any toothpaste or mouthwash.   Do not wear jewelry, make-up, hairpins, clips or nail polish.  Do not wear lotions,  powders, or perfumes. You may wear deodorant.  Do not shave 48 hours prior to surgery. Men may shave face and neck.  Do not bring valuables to the hospital.    [facility] is not responsible for any belongings or valuables.               Contacts, dentures or bridgework may not be worn into surgery.  Leave your suitcase in the car. After surgery it may be brought to your room.  For patients admitted to the hospital, discharge time is determined by your                       treatment team.  _  Patients discharged the day of surgery will not be allowed to drive home.  You will need someone to drive you home and stay with you the night of your procedure.    Please read over the following fact sheets that you were given:   [facility] Preparing for Surgery and or MRSA Information   _x___ Take anti-hypertensive listed below, cardiac, seizure, asthma,     anti-reflux and psychiatric medicines. These include:  1. trospium (SANCTURA) 20 MG tablet  2.  3.  4.  5.  6.  ____Fleets enema or Magnesium Citrate as directed.   ____ Use  CHG Soap or sage wipes as directed on instruction sheet   ____ Use inhalers on the day of surgery and bring to hospital day of surgery  ____ Stop Metformin and Janumet 2 days prior to surgery.    ____ Take 1/2 of usual insulin dose the night before surgery and none on the morning     surgery.   _x___ Follow recommendations from Cardiologist, Pulmonologist or PCP regarding          stopping Aspirin, Coumadin, Plavix ,Eliquis, Effient, or Pradaxa, and Pletal.  X____Stop Anti-inflammatories such as Advil, Aleve, Ibuprofen, Motrin, Naproxen, Naprosyn, Goodies powders or aspirin products. OK to take Tylenol and                          Celebrex.   _x___ Stop supplements until after surgery.  But may continue Vitamin D, Vitamin B,       and multivitamin.   ____ Bring C-Pap to the hospital.

## 2019-09-05 NOTE — Telephone Encounter (Signed)
Called pt informed him that RX for Trospium was sent on 08/07/19 w/ 3 refills, he needs to call the pharmacy. Pt gave verbal understanding.

## 2019-09-05 NOTE — Telephone Encounter (Signed)
Request refill on trospium

## 2019-09-06 LAB — SARS CORONAVIRUS 2 (TAT 6-24 HRS): SARS Coronavirus 2: NEGATIVE

## 2019-09-08 MED ORDER — SODIUM CHLORIDE 0.9 % IV SOLN
1.0000 g | INTRAVENOUS | Status: AC
  Administered 2019-09-09: 09:00:00 1 g via INTRAVENOUS
  Filled 2019-09-08: qty 1

## 2019-09-09 ENCOUNTER — Encounter
Admission: RE | Disposition: A | Discharge status: Home / Self Care | Payer: Self-pay | Source: Home / Self Care | Attending: Urology

## 2019-09-09 ENCOUNTER — Ambulatory Visit: Payer: PPO | Admitting: Anesthesiology

## 2019-09-09 ENCOUNTER — Other Ambulatory Visit: Payer: Self-pay

## 2019-09-09 ENCOUNTER — Ambulatory Visit
Admission: RE | Admit: 2019-09-09 | Discharge: 2019-09-09 | Disposition: A | Discharge status: Home / Self Care | Payer: PPO | Attending: Urology | Admitting: Urology

## 2019-09-09 DIAGNOSIS — N401 Enlarged prostate with lower urinary tract symptoms: Secondary | ICD-10-CM | POA: Insufficient documentation

## 2019-09-09 DIAGNOSIS — Z20828 Contact with and (suspected) exposure to other viral communicable diseases: Secondary | ICD-10-CM | POA: Diagnosis not present

## 2019-09-09 DIAGNOSIS — N400 Enlarged prostate without lower urinary tract symptoms: Secondary | ICD-10-CM | POA: Diagnosis not present

## 2019-09-09 DIAGNOSIS — N210 Calculus in bladder: Secondary | ICD-10-CM

## 2019-09-09 DIAGNOSIS — N138 Other obstructive and reflux uropathy: Secondary | ICD-10-CM

## 2019-09-09 DIAGNOSIS — K219 Gastro-esophageal reflux disease without esophagitis: Secondary | ICD-10-CM | POA: Insufficient documentation

## 2019-09-09 DIAGNOSIS — K760 Fatty (change of) liver, not elsewhere classified: Secondary | ICD-10-CM | POA: Insufficient documentation

## 2019-09-09 DIAGNOSIS — Z79899 Other long term (current) drug therapy: Secondary | ICD-10-CM | POA: Diagnosis not present

## 2019-09-09 HISTORY — PX: CYSTOSCOPY WITH LITHOLAPAXY: SHX1425

## 2019-09-09 HISTORY — PX: CYSTOSCOPY WITH INSERTION OF UROLIFT: SHX6678

## 2019-09-09 SURGERY — CYSTOSCOPY WITH INSERTION OF UROLIFT
Anesthesia: General

## 2019-09-09 MED ORDER — FAMOTIDINE 20 MG PO TABS
ORAL_TABLET | ORAL | Status: AC
  Administered 2019-09-09: 20 mg via ORAL
  Filled 2019-09-09: qty 1

## 2019-09-09 MED ORDER — SUGAMMADEX SODIUM 200 MG/2ML IV SOLN
INTRAVENOUS | Status: DC | PRN
  Administered 2019-09-09: 200 mg via INTRAVENOUS

## 2019-09-09 MED ORDER — SODIUM CHLORIDE 0.9 % IV SOLN
INTRAVENOUS | Status: DC
  Administered 2019-09-09 (×2): via INTRAVENOUS

## 2019-09-09 MED ORDER — LIDOCAINE HCL (CARDIAC) PF 100 MG/5ML IV SOSY
PREFILLED_SYRINGE | INTRAVENOUS | Status: DC | PRN
  Administered 2019-09-09: 100 mg via INTRAVENOUS

## 2019-09-09 MED ORDER — SULFAMETHOXAZOLE-TRIMETHOPRIM 800-160 MG PO TABS: 1.0000 | ORAL_TABLET | Freq: Two times a day (BID) | ORAL | 0 refills | Status: DC

## 2019-09-09 MED ORDER — LIDOCAINE HCL (PF) 2 % IJ SOLN
INTRAMUSCULAR | Status: AC
  Filled 2019-09-09: qty 10

## 2019-09-09 MED ORDER — PROPOFOL 10 MG/ML IV BOLUS
INTRAVENOUS | Status: AC
  Filled 2019-09-09: qty 20

## 2019-09-09 MED ORDER — ROCURONIUM BROMIDE 100 MG/10ML IV SOLN
INTRAVENOUS | Status: DC | PRN
  Administered 2019-09-09: 10 mg via INTRAVENOUS
  Administered 2019-09-09: 5 mg via INTRAVENOUS
  Administered 2019-09-09: 10 mg via INTRAVENOUS

## 2019-09-09 MED ORDER — FAMOTIDINE 20 MG PO TABS
20.0000 mg | ORAL_TABLET | Freq: Once | ORAL | Status: AC
  Administered 2019-09-09: 08:00:00 20 mg via ORAL

## 2019-09-09 MED ORDER — ONDANSETRON HCL 4 MG/2ML IJ SOLN
INTRAMUSCULAR | Status: AC
  Filled 2019-09-09: qty 2

## 2019-09-09 MED ORDER — HYDROCODONE-ACETAMINOPHEN 5-325 MG PO TABS: 1.0000 | ORAL_TABLET | ORAL | 0 refills | Status: DC | PRN

## 2019-09-09 MED ORDER — DEXAMETHASONE SODIUM PHOSPHATE 4 MG/ML IJ SOLN
INTRAMUSCULAR | Status: AC
  Filled 2019-09-09: qty 1

## 2019-09-09 MED ORDER — PHENYLEPHRINE HCL (PRESSORS) 10 MG/ML IV SOLN
INTRAVENOUS | Status: DC | PRN
  Administered 2019-09-09 (×2): 100 ug via INTRAVENOUS
  Administered 2019-09-09: 150 ug via INTRAVENOUS
  Administered 2019-09-09: 100 ug via INTRAVENOUS
  Administered 2019-09-09: 150 ug via INTRAVENOUS
  Administered 2019-09-09: 50 ug via INTRAVENOUS
  Administered 2019-09-09: 100 ug via INTRAVENOUS
  Administered 2019-09-09: 50 ug via INTRAVENOUS

## 2019-09-09 MED ORDER — SODIUM CHLORIDE 0.9 % IV SOLN
INTRAVENOUS | Status: DC | PRN
  Administered 2019-09-09: 35 ug/min via INTRAVENOUS

## 2019-09-09 MED ORDER — HYDROMORPHONE HCL 1 MG/ML IJ SOLN
0.2500 mg | INTRAMUSCULAR | Status: AC | PRN
  Administered 2019-09-09 (×8): 0.25 mg via INTRAVENOUS

## 2019-09-09 MED ORDER — ONDANSETRON HCL 4 MG/2ML IJ SOLN
4.0000 mg | Freq: Once | INTRAMUSCULAR | Status: AC | PRN
  Administered 2019-09-09: 4 mg via INTRAVENOUS

## 2019-09-09 MED ORDER — HYDROMORPHONE HCL 1 MG/ML IJ SOLN
INTRAMUSCULAR | Status: AC
  Administered 2019-09-09: 0.25 mg via INTRAVENOUS
  Filled 2019-09-09: qty 1

## 2019-09-09 MED ORDER — FENTANYL CITRATE (PF) 100 MCG/2ML IJ SOLN
INTRAMUSCULAR | Status: AC
  Administered 2019-09-09: 25 ug via INTRAVENOUS
  Filled 2019-09-09: qty 2

## 2019-09-09 MED ORDER — FENTANYL CITRATE (PF) 100 MCG/2ML IJ SOLN
INTRAMUSCULAR | Status: AC
  Filled 2019-09-09: qty 2

## 2019-09-09 MED ORDER — ONDANSETRON HCL 4 MG/2ML IJ SOLN
INTRAMUSCULAR | Status: DC | PRN
  Administered 2019-09-09: 4 mg via INTRAVENOUS

## 2019-09-09 MED ORDER — SUCCINYLCHOLINE CHLORIDE 20 MG/ML IJ SOLN
INTRAMUSCULAR | Status: AC
  Filled 2019-09-09: qty 1

## 2019-09-09 MED ORDER — FENTANYL CITRATE (PF) 100 MCG/2ML IJ SOLN
INTRAMUSCULAR | Status: DC | PRN
  Administered 2019-09-09 (×2): 50 ug via INTRAVENOUS

## 2019-09-09 MED ORDER — ROCURONIUM BROMIDE 50 MG/5ML IV SOLN
INTRAVENOUS | Status: AC
  Filled 2019-09-09: qty 1

## 2019-09-09 MED ORDER — BELLADONNA ALKALOIDS-OPIUM 16.2-60 MG RE SUPP
RECTAL | Status: AC
  Filled 2019-09-09: qty 1

## 2019-09-09 MED ORDER — BELLADONNA ALKALOIDS-OPIUM 16.2-60 MG RE SUPP
1.0000 | Freq: Once | RECTAL | Status: AC
  Administered 2019-09-09: 1 via RECTAL

## 2019-09-09 MED ORDER — PROPOFOL 10 MG/ML IV BOLUS
INTRAVENOUS | Status: DC | PRN
  Administered 2019-09-09: 150 mg via INTRAVENOUS

## 2019-09-09 MED ORDER — SUCCINYLCHOLINE CHLORIDE 20 MG/ML IJ SOLN
INTRAMUSCULAR | Status: DC | PRN
  Administered 2019-09-09: 120 mg via INTRAVENOUS

## 2019-09-09 MED ORDER — FENTANYL CITRATE (PF) 100 MCG/2ML IJ SOLN
25.0000 ug | INTRAMUSCULAR | Status: AC | PRN
  Administered 2019-09-09 (×6): 25 ug via INTRAVENOUS

## 2019-09-09 SURGICAL SUPPLY — 23 items
BAG DRAIN CYSTO-URO LG1000N (MISCELLANEOUS) ×3 IMPLANT
BAG URINE DRAIN 2000ML AR STRL (UROLOGICAL SUPPLIES) ×3 IMPLANT
BASKET ZERO TIP 1.9FR (BASKET) IMPLANT
BRUSH SCRUB EZ  4% CHG (MISCELLANEOUS) ×2
BRUSH SCRUB EZ 4% CHG (MISCELLANEOUS) ×1 IMPLANT
CATH COUDE FOLEY 2W 5CC 20FR (CATHETERS) ×3 IMPLANT
FIBER LASER FLEXIVA 1000 (UROLOGICAL SUPPLIES) ×3 IMPLANT
GLOVE BIO SURGEON STRL SZ8 (GLOVE) ×3 IMPLANT
GOWN STRL REUS W/ TWL LRG LVL3 (GOWN DISPOSABLE) ×2 IMPLANT
GOWN STRL REUS W/ TWL XL LVL3 (GOWN DISPOSABLE) ×1 IMPLANT
GOWN STRL REUS W/TWL LRG LVL3 (GOWN DISPOSABLE) ×4
GOWN STRL REUS W/TWL XL LVL3 (GOWN DISPOSABLE) ×2
IV NS IRRIG 3000ML ARTHROMATIC (IV SOLUTION) ×30 IMPLANT
KIT PROBE TRILOGY 3.9X350 (MISCELLANEOUS) IMPLANT
KIT TURNOVER CYSTO (KITS) ×3 IMPLANT
PACK CYSTO AR (MISCELLANEOUS) ×3 IMPLANT
SET CYSTO W/LG BORE CLAMP LF (SET/KITS/TRAYS/PACK) ×3 IMPLANT
SET IRRIG Y TYPE TUR BLADDER L (SET/KITS/TRAYS/PACK) ×3 IMPLANT
SURGILUBE 2OZ TUBE FLIPTOP (MISCELLANEOUS) ×3 IMPLANT
SYRINGE IRR TOOMEY STRL 70CC (SYRINGE) ×3 IMPLANT
SYSTEM UROLIFT (Male Continence) ×18 IMPLANT
WATER STERILE IRR 1000ML POUR (IV SOLUTION) ×3 IMPLANT
WATER STERILE IRR 3000ML UROMA (IV SOLUTION) ×3 IMPLANT

## 2019-09-09 NOTE — Anesthesia Preprocedure Evaluation (Signed)
Anesthesia Evaluation  Patient identified by MRN, date of birth, ID band Patient awake    Reviewed: Allergy & Precautions, NPO status , Patient's Chart, lab work & pertinent test results  History of Anesthesia Complications Negative for: history of anesthetic complications  Airway Mallampati: II       Dental   Pulmonary neg sleep apnea, neg COPD, Not current smoker,           Cardiovascular (-) hypertension(-) Past MI and (-) CHF (-) dysrhythmias (-) Valvular Problems/Murmurs     Neuro/Psych neg Seizures    GI/Hepatic Neg liver ROS, GERD  ,  Endo/Other  neg diabetes  Renal/GU negative Renal ROS     Musculoskeletal   Abdominal   Peds  Hematology   Anesthesia Other Findings   Reproductive/Obstetrics                             Anesthesia Physical  Anesthesia Plan  ASA: II  Anesthesia Plan: General   Post-op Pain Management:    Induction: Intravenous  PONV Risk Score and Plan: Propofol infusion  Airway Management Planned: Nasal Cannula  Additional Equipment:   Intra-op Plan:   Post-operative Plan:   Informed Consent: I have reviewed the patients History and Physical, chart, labs and discussed the procedure including the risks, benefits and alternatives for the proposed anesthesia with the patient or authorized representative who has indicated his/her understanding and acceptance.       Plan Discussed with: CRNA and Surgeon  Anesthesia Plan Comments:         Anesthesia Quick Evaluation

## 2019-09-09 NOTE — Interval H&P Note (Signed)
History and Physical Interval Note:  09/09/2019 8:27 AM  Joel Carter  has presented today for surgery, with the diagnosis of BPH,BLADDER CALCULI.  The various methods of treatment have been discussed with the patient and family. After consideration of risks, benefits and other options for treatment, the patient has consented to  Procedure(s): CYSTOSCOPY WITH INSERTION OF UROLIFT (N/A) CYSTOSCOPY WITH LITHOLAPAXY (N/A) as a surgical intervention.  The patient's history has been reviewed, patient examined, no change in status, stable for surgery.  I have reviewed the patient's chart and labs.  Questions were answered to the patient's satisfaction.     [place]

## 2019-09-09 NOTE — H&P (View-Only) (Signed)
09/09/2019 7:30 AM   Joel Carter 01-15-1941 [id number]  Referring provider: No referring provider defined for this encounter.    HPI: 78 y.o. male recently seen for hematuria and significant storage related voiding symptoms.  CTU remarkable for >10 bladder calculi with several measuring greater than 1 cm in size.  Prostate volume was 66 cc.  He is scheduled for cystolitholapaxy and UroLift however due to the volume of his bladder calculi may need to be a staged procedure.   PMH: Past Medical History:  Diagnosis Date  . BPH without obstruction/lower urinary tract symptoms   . GERD (gastroesophageal reflux disease)     Surgical History: Past Surgical History:  Procedure Laterality Date  . COLONOSCOPY  2011   tubular adenoma  . COLONOSCOPY WITH PROPOFOL N/A 08/07/2017   Procedure: COLONOSCOPY WITH PROPOFOL;  Surgeon: Lollie Sails, MD;  Location: [facility] ENDOSCOPY;  Service: Endoscopy;  Laterality: N/A;  . ESOPHAGOGASTRODUODENOSCOPY    . TONSILLECTOMY      Home Medications:   finasteride 5 MG tablet Commonly known as: PROSCAR Take 1 tablet (5 mg total) by mouth daily.   multivitamin capsule Take 1 capsule by mouth daily.   sulfamethoxazole-trimethoprim 800-160 MG tablet Commonly known as: BACTRIM DS Take 1 tablet by mouth 2 (two) times daily for 14 days. Started by: Debroah Loop, PA-C   tamsulosin 0.4 MG Caps capsule Commonly known as: FLOMAX Take 1 capsule (0.4 mg total) by mouth daily.   trospium 20 MG tablet Commonly known as: SANCTURA Take 1 tablet (20 mg total) by mouth 2 (two) times daily for 14 days. Started by: Debroah Loop, PA-C    Allergies: No Known Allergies  Family History: Family History  Problem Relation Age of Onset  . Diabetes Other        multiple family members  . Dementia Mother     Social History:  reports that he has never smoked. He has never used smokeless tobacco. He reports previous alcohol use of  about 4.0 standard drinks of alcohol per week. He reports that he does not use drugs.  ROS: No significant change 07/21/2019  Physical Exam: There were no vitals taken for this visit.  Constitutional:  Alert and oriented, No acute distress. HEENT: [place] AT, moist mucus membranes.  Trachea midline, no masses. Cardiovascular: No clubbing, cyanosis, or edema. Respiratory: Normal respiratory effort, no increased work of breathing. GI: Abdomen is soft, nontender, nondistended, no abdominal masses GU: No CVA tenderness Lymph: No cervical or inguinal lymphadenopathy. Skin: No rashes, bruises or suspicious lesions. Neurologic: Grossly intact, no focal deficits, moving all 4 extremities. Psychiatric: Normal mood and affect.   Pertinent Imaging:  Results for orders placed in visit on 07/24/19  CT HEMATURIA WORKUP   Narrative CLINICAL DATA:  Microscopic hematuria. Polyuria. Episode of gross hematuria 2 weeks ago. Dysuria.  EXAM: CT ABDOMEN AND PELVIS WITHOUT AND WITH CONTRAST  TECHNIQUE: Multidetector CT imaging of the abdomen and pelvis was performed following the standard protocol before and following the bolus administration of intravenous contrast.  CONTRAST:  140mL OMNIPAQUE IOHEXOL 300 MG/ML  SOLN  COMPARISON:  None.  FINDINGS: Lower chest: Upper normal heart size.  Hepatobiliary: Diffuse hepatic steatosis. Gallbladder unremarkable.  Pancreas: Unremarkable  Spleen: Unremarkable  Adrenals/Urinary Tract: There are over 10 mobile stones in the urinary bladder measuring up to 1.7 cm in diameter. No renal or ureteral calculi identified.  Adrenal glands appear normal. Two small hypodense lesions in the right kidney technically too small to characterize  although statistically likely to be cysts. Borderline wall thickening in the urinary bladder. No additional focal filling defects or focal enhancement along the urothelium.  Stomach/Bowel: A 4.5 by 3.7 cm soft tissue density in  the cecum near the ileocecal valve demonstrates no significant appreciable enhancement and accordingly most likely represents stool rather than a colon mass.  Vascular/Lymphatic: Aortoiliac atherosclerotic vascular disease. No adenopathy.  Reproductive: Moderate prostatomegaly.  Other: No supplemental non-categorized findings.  Musculoskeletal: Fused SI joints. Grade 1 degenerative anterolisthesis at L4-5. Lumbar spondylosis and degenerative disc disease resulting in bilateral foraminal impingement at L3-4 and L4-5, and right foraminal impingement at L5-S1.  IMPRESSION: 1. There are over 10 mobile stones in the urinary bladder measuring up to 1.7 cm in diameter. 2. Borderline wall thickening in the urinary bladder, query low-grade cystitis. 3. Moderate prostatomegaly. 4. Diffuse hepatic steatosis. 5. Lumbar spondylosis and degenerative disc disease causing impingement at L3-4 and L4-5, and right foraminal impingement at L5-S1.  Aortic Atherosclerosis (ICD10-I70.0).   Electronically Signed   By: Van Clines M.D.   On: 08/01/2019 16:51      Assessment & Plan:   78 y.o. male with BPH and multiple bladder calculi.  He presents today for cystolitholapaxy and UroLift however due to the number and size of his bladder calculi this most likely will need to be a staged procedure.  The procedure has been discussed in detail including potential risks of bleeding, infection and persistent voiding symptoms.  He indicated all questions were answered and desires to proceed.    Abbie Sons, [place] [address] [place], [place] [address] [phone]

## 2019-09-09 NOTE — Op Note (Signed)
Preoperative diagnosis:  1. BPH with lower urinary tract symptoms 2. Multiple bladder calculi  Postoperative diagnosis:  1. Same  Procedure: 1. Urolift (6 implants) 2. Cystolitholapaxy-staged  Surgeon: Abbie Sons, MD  Anesthesia: General  Complications: None  Intraoperative findings:  1.  >12 bladder calculi with 10 calculi measuring 10-15 mm.  EBL: Minimal  Specimens: None  Indication: Joel Carter is a 78 y.o. male who presented with hematuria and bothersome lower urinary tract symptoms.  CT urogram remarkable for multiple bladder calculi (1500 HU).  Cystoscopy remarkable for lateral lobe enlargement with mild bladder neck elevation and multiple bladder calculi.  He presents for staged cystolitholapaxy.  We discussed outlet procedures and he desired Urolift.  After reviewing the management options for treatment, he elected to proceed with the above surgical procedure(s). We have discussed the potential benefits and risks of the procedure, side effects of the proposed treatment, the likelihood of the patient achieving the goals of the procedure, and any potential problems that might occur during the procedure or recuperation. Informed consent has been obtained.  Description of procedure:  The patient was taken to the operating room and general anesthesia was induced.  The patient was placed in the dorsal lithotomy position, prepped and draped in the usual sterile fashion, and preoperative antibiotics were administered. A preoperative time-out was performed.   A 17 French scope was lubricated and passed under direct vision.  The urethra was normal in caliber without stricture.  There was lateral lobe enlargement and mild bladder neck elevation.  The cystoscopy bridge was replaced with a UroLift delivery device.  The 1st pair of implants were placed 1.5-2 cm from the bladder neck and the anterior aspect of the prostatic urethra. The 2nd pair of implants were placed just  proximal to the verumontanum in the same plane anteriorly as the proximal implants. The delivery device was then replaced with cystoscope and bridge and the implant location and opening effect was confirmed cystoscopically. An additional pair of implants was static between the prior pairs.  A final cystoscopy was conducted first to inspect the location and state of each implant and second, to confirm the presence of a continuous anterior channel was present through the prostatic urethra with irrigation flow turned off.  No injury to the bladder or urethra was detected. Six implants were delivered in total.  The 26 French cystoscope was removed.  The distal urethra was sounded to 94 [place] and a 27 [place] continuous-flow resectoscope sheath with visual obturator was lubricated and passed under direct vision.  The obturator was replaced with a laser bridge.  Multiple bladder calculi were noted as described above.  Mild mucosal erythema noted secondary to the calculi.  No solid or papillary lesions were identified.  A 1000 m holmium laser fiber was placed through the laser bridge.  Cystolitholapaxy was commenced at initial settings of 0.4 J / 40 Hz which was increased to 0.5 J / 50 Hz.  The calculi were very dense and the left pedal power was increased to 1 J / 40 Hz.  6 of the larger calculi were treated and fragments were removed with irrigation.  There were 5 large calculi remaining.  Anesthesia time had been approximately 2 hours and it was elected to complete the remaining calculi staged as initially planned.  A 20 French Foley catheter was placed without difficulty.  The catheter was irrigated with return of pink-tinged effluent.  After anesthetic reversal the patient was transported to the PACU in stable  condition.   Abbie Sons, M.D.

## 2019-09-09 NOTE — Transfer of Care (Signed)
Immediate Anesthesia Transfer of Care Note  Patient: Joel Carter  Procedure(s) Performed: CYSTOSCOPY WITH INSERTION OF UROLIFT (N/A ) CYSTOSCOPY WITH LITHOLAPAXY (N/A )  Patient Location: PACU  Anesthesia Type:General  Level of Consciousness: awake and alert   Airway & Oxygen Therapy: Patient Spontanous Breathing and Patient connected to face mask oxygen  Post-op Assessment: Report given to RN and Post -op Vital signs reviewed and stable  Post vital signs: Reviewed and stable  Last Vitals:  Vitals Value Taken Time  BP 119/77 09/09/19 1030  Temp 36.7 C 09/09/19 1030  Pulse 68 09/09/19 1033  Resp 19 09/09/19 1033  SpO2 98 % 09/09/19 1033  Vitals shown include unvalidated device data.  Last Pain:  Vitals:   09/09/19 1030  TempSrc:   PainSc: Asleep         Complications: No apparent anesthesia complications

## 2019-09-09 NOTE — Discharge Instructions (Signed)
AMBULATORY SURGERY  DISCHARGE INSTRUCTIONS   1) The drugs that you were given will stay in your system until tomorrow so for the next 24 hours you should not:  A) Drive an automobile B) Make any legal decisions C) Drink any alcoholic beverage   2) You may resume regular meals tomorrow.  Today it is better to start with liquids and gradually work up to solid foods.  You may eat anything you prefer, but it is better to start with liquids, then soup and crackers, and gradually work up to solid foods.   3) Please notify your doctor immediately if you have any unusual bleeding, trouble breathing, redness and pain at the surgery site, drainage, fever, or pain not relieved by medication.    4) Additional Instructions:   Cystolitholapaxy    General instructions:     Your recent bladder surgery requires very little post hospital care but some definite precautions.  Because the raw surface inside your bladder and the irritating effects of urine you may expect frequency of urination and/or urgency (a stronger desire to urinate) and perhaps even getting up at night more often. This will usually resolve or improve slowly over the healing period. You may see some blood in your urine over the first 2 weeks. Do not be alarmed, even if the urine was clear for a while. Get off your feet and drink lots of fluids until clearing occurs. If you start to pass clots or don't improve call us.  Diet:  You may return to your normal diet immediately. Because of the raw surface of your bladder, alcohol, spicy foods, foods high in acid and drinks with caffeine may cause irritation or frequency and should be used in moderation. To keep your urine flowing freely and avoid constipation, drink plenty of fluids during the day (8-10 glasses). Tip: Avoid cranberry juice because it is very acidic.  Activity:  Your physical activity doesn't need to be restricted. However, if you are very active, you may see some  blood in the urine. We suggest that you reduce your activity under the circumstances until the bleeding has stopped.  Bowels:  It is important to keep your bowels regular during the postoperative period. Straining with bowel movements can cause bleeding. A bowel movement every other day is reasonable. Use a mild laxative if needed, such as milk of magnesia 2-3 tablespoons, or 2 Dulcolax tablets. Call if you continue to have problems. If you had been taking narcotics for pain, before, during or after your surgery, you may be constipated. Take a laxative if necessary.    Medication:  You should resume your pre-surgery medications unless told not to. In addition you may be given an antibiotic to prevent or treat infection. Antibiotics are not always necessary. All medication should be taken as prescribed until the bottles are finished unless you are having an unusual reaction to one of the drugs.  A prescription for pain medication was sent to your pharmacy   [facility] [address], [place], [place] [address] [phone]        Please contact your physician with any problems or Same Day Surgery at [phone], Monday through Friday 6 am to 4 pm, or [place] at [facility] number at [phone].

## 2019-09-09 NOTE — Anesthesia Post-op Follow-up Note (Signed)
Anesthesia QCDR form completed.        

## 2019-09-09 NOTE — H&P (Signed)
09/09/2019 7:30 AM   Joel Carter [birth year] [id number]  Referring provider: No referring provider defined for this encounter.    HPI: 78 y.o. male recently seen for hematuria and significant storage related voiding symptoms.  CTU remarkable for >10 bladder calculi with several measuring greater than 1 cm in size.  Prostate volume was 66 cc.  He is scheduled for cystolitholapaxy and UroLift however due to the volume of his bladder calculi may need to be a staged procedure.   PMH: Past Medical History:  Diagnosis Date  . BPH without obstruction/lower urinary tract symptoms   . GERD (gastroesophageal reflux disease)     Surgical History: Past Surgical History:  Procedure Laterality Date  . COLONOSCOPY  2011   tubular adenoma  . COLONOSCOPY WITH PROPOFOL N/A 08/07/2017   Procedure: COLONOSCOPY WITH PROPOFOL;  Surgeon: Lollie Sails, MD;  Location: [facility] END[facility]COPY;  Service: Endoscopy;  Laterality: N/A;  . ESOPHAGOGASTRODUODENOSCOPY    . TONSILLECTOMY      Home Medications:   finasteride 5 MG tablet Commonly known as: PROSCAR Take 1 tablet (5 mg total) by mouth daily.   multivitamin capsule Take 1 capsule by mouth daily.   sulfamethoxazole-trimethoprim 800-160 MG tablet Commonly known as: BACTRIM DS Take 1 tablet by mouth 2 (two) times daily for 14 days. Started by: Debroah Loop, PA-C   tamsulosin 0.4 MG Caps capsule Commonly known as: FLOMAX Take 1 capsule (0.4 mg total) by mouth daily.   trospium 20 MG tablet Commonly known as: SANCTURA Take 1 tablet (20 mg total) by mouth 2 (two) times daily for 14 days. Started by: Debroah Loop, PA-C    Allergies: No Known Allergies  Family History: Family History  Problem Relation Age of Onset  . Diabetes Other        multiple family members  . Dementia Mother     Social History:  reports that he has never smoked. He has never used smokeless tobacco. He reports previous alcohol use of  about 4.0 standard drinks of alcohol per week. He reports that he does not use drugs.  ROS: No significant change 07/21/2019  Physical Exam: There were no vitals taken for this visit.  Constitutional:  Alert and oriented, No acute distress. HEENT: [place] AT, moist mucus membranes.  Trachea midline, no masses. Cardiovascular: No clubbing, cyanosis, or edema. Respiratory: Normal respiratory effort, no increased work of breathing. GI: Abdomen is soft, nontender, nondistended, no abdominal masses GU: No CVA tenderness Lymph: No cervical or inguinal lymphadenopathy. Skin: No rashes, bruises or suspicious lesions. Neurologic: Grossly intact, no focal deficits, moving all 4 extremities. Psychiatric: Normal mood and affect.   Pertinent Imaging:  Results for orders placed in visit on 07/24/19  CT HEMATURIA WORKUP   Narrative CLINICAL DATA:  Microscopic hematuria. Polyuria. Episode of gross hematuria 2 weeks ago. Dysuria.  EXAM: CT ABDOMEN AND PELVIS WITHOUT AND WITH CONTRAST  TECHNIQUE: Multidetector CT imaging of the abdomen and pelvis was performed following the standard protocol before and following the bolus administration of intravenous contrast.  CONTRAST:  126mL OMNIPAQUE IOHEXOL 300 MG/ML  SOLN  COMPARISON:  None.  FINDINGS: Lower chest: Upper normal heart size.  Hepatobiliary: Diffuse hepatic steatosis. Gallbladder unremarkable.  Pancreas: Unremarkable  Spleen: Unremarkable  Adrenals/Urinary Tract: There are over 10 mobile stones in the urinary bladder measuring up to 1.7 cm in diameter. No renal or ureteral calculi identified.  Adrenal glands appear normal. Two small hypodense lesions in the right kidney technically too small to characterize  although statistically likely to be cysts. Borderline wall thickening in the urinary bladder. No additional focal filling defects or focal enhancement along the urothelium.  Stomach/Bowel: A 4.5 by 3.7 cm soft tissue density in  the cecum near the ileocecal valve demonstrates no significant appreciable enhancement and accordingly most likely represents stool rather than a colon mass.  Vascular/Lymphatic: Aortoiliac atherosclerotic vascular disease. No adenopathy.  Reproductive: Moderate prostatomegaly.  Other: No supplemental non-categorized findings.  Musculoskeletal: Fused SI joints. Grade 1 degenerative anterolisthesis at L4-5. Lumbar spondylosis and degenerative disc disease resulting in bilateral foraminal impingement at L3-4 and L4-5, and right foraminal impingement at L5-S1.  IMPRESSION: 1. There are over 10 mobile stones in the urinary bladder measuring up to 1.7 cm in diameter. 2. Borderline wall thickening in the urinary bladder, query low-grade cystitis. 3. Moderate prostatomegaly. 4. Diffuse hepatic steatosis. 5. Lumbar spondylosis and degenerative disc disease causing impingement at L3-4 and L4-5, and right foraminal impingement at L5-S1.  Aortic Atherosclerosis (ICD10-I70.0).   Electronically Signed   By: Van Clines M.D.   On: 08/01/2019 16:51      Assessment & Plan:   78 y.o. male with BPH and multiple bladder calculi.  He presents today for cystolitholapaxy and UroLift however due to the number and size of his bladder calculi this most likely will need to be a staged procedure.  The procedure has been discussed in detail including potential risks of bleeding, infection and persistent voiding symptoms.  He indicated all questions were answered and desires to proceed.    Abbie Sons, [place] [address] [place], [place] [address] [phone]

## 2019-09-10 ENCOUNTER — Ambulatory Visit: Payer: PPO | Admitting: Urology

## 2019-09-10 ENCOUNTER — Encounter: Payer: Self-pay | Admitting: Urology

## 2019-09-11 ENCOUNTER — Telehealth: Payer: Self-pay | Admitting: Urology

## 2019-09-11 NOTE — Anesthesia Postprocedure Evaluation (Signed)
Anesthesia Post Note  Patient: NATHNAEL SHAWCROFT  Procedure(s) Performed: CYSTOSCOPY WITH INSERTION OF UROLIFT (N/A ) CYSTOSCOPY WITH LITHOLAPAXY (N/A )  Patient location during evaluation: PACU Anesthesia Type: General Level of consciousness: awake and alert and oriented Pain management: pain level controlled Vital Signs Assessment: post-procedure vital signs reviewed and stable Respiratory status: spontaneous breathing Cardiovascular status: blood pressure returned to baseline Anesthetic complications: no     Last Vitals:  Vitals:   09/09/19 1253 09/09/19 1424  BP: 132/71 130/73  Pulse: 80 71  Resp: 18 (!) 22  Temp: (!) 36.3 C   SpO2: 93% 95%    Last Pain:  Vitals:   09/09/19 1253  TempSrc: Temporal  PainSc: 3                  Semaje Kinker

## 2019-09-11 NOTE — Telephone Encounter (Signed)
Pt called and wants to know when he can have his cath removed, He was asking if it would be before his surgery on 09/16/2019. Please advise.

## 2019-09-11 NOTE — Telephone Encounter (Signed)
Called pt no answer. Left detailed message for pt informing him that per [facility] he can have is cath removed tomorrow if he wishes or if his sx are better with the cath in place he may leave it in until surgery. Advised pt to call back with his preference. 1st attempt.

## 2019-09-12 ENCOUNTER — Other Ambulatory Visit
Admission: RE | Admit: 2019-09-12 | Discharge: 2019-09-12 | Disposition: A | Discharge status: Home / Self Care | Payer: PPO | Source: Home / Self Care | Attending: Urology | Admitting: Urology

## 2019-09-12 ENCOUNTER — Other Ambulatory Visit: Payer: Self-pay

## 2019-09-12 DIAGNOSIS — N401 Enlarged prostate with lower urinary tract symptoms: Secondary | ICD-10-CM | POA: Diagnosis not present

## 2019-09-12 LAB — SARS CORONAVIRUS 2 (TAT 6-24 HRS): SARS Coronavirus 2: NEGATIVE

## 2019-09-15 ENCOUNTER — Other Ambulatory Visit: Payer: Self-pay | Admitting: Urology

## 2019-09-15 DIAGNOSIS — N210 Calculus in bladder: Secondary | ICD-10-CM

## 2019-09-15 DIAGNOSIS — R3129 Other microscopic hematuria: Secondary | ICD-10-CM

## 2019-09-16 ENCOUNTER — Other Ambulatory Visit: Payer: Self-pay

## 2019-09-16 ENCOUNTER — Ambulatory Visit: Payer: PPO | Admitting: Anesthesiology

## 2019-09-16 ENCOUNTER — Ambulatory Visit
Admission: RE | Admit: 2019-09-16 | Discharge: 2019-09-16 | Disposition: A | Discharge status: Home / Self Care | Payer: PPO | Attending: Urology | Admitting: Urology

## 2019-09-16 ENCOUNTER — Encounter: Payer: Self-pay | Admitting: *Deleted

## 2019-09-16 ENCOUNTER — Encounter
Admission: RE | Disposition: A | Discharge status: Home / Self Care | Payer: Self-pay | Source: Home / Self Care | Attending: Urology

## 2019-09-16 DIAGNOSIS — N400 Enlarged prostate without lower urinary tract symptoms: Secondary | ICD-10-CM | POA: Diagnosis not present

## 2019-09-16 DIAGNOSIS — N210 Calculus in bladder: Secondary | ICD-10-CM | POA: Insufficient documentation

## 2019-09-16 DIAGNOSIS — Z79899 Other long term (current) drug therapy: Secondary | ICD-10-CM | POA: Diagnosis not present

## 2019-09-16 HISTORY — PX: CYSTOSCOPY WITH LITHOLAPAXY: SHX1425

## 2019-09-16 SURGERY — CYSTOSCOPY, WITH BLADDER CALCULUS LITHOLAPAXY
Anesthesia: General | Site: Bladder

## 2019-09-16 MED ORDER — FENTANYL CITRATE (PF) 100 MCG/2ML IJ SOLN
INTRAMUSCULAR | Status: AC
  Filled 2019-09-16: qty 2

## 2019-09-16 MED ORDER — ONDANSETRON HCL 4 MG/2ML IJ SOLN
INTRAMUSCULAR | Status: AC
  Filled 2019-09-16: qty 2

## 2019-09-16 MED ORDER — SODIUM CHLORIDE 0.9 % IV SOLN
1.0000 g | INTRAVENOUS | Status: AC
  Administered 2019-09-16: 09:00:00 1 g via INTRAVENOUS
  Filled 2019-09-16: qty 1

## 2019-09-16 MED ORDER — OXYCODONE HCL 5 MG/5ML PO SOLN: 5.0000 mg | Freq: Once | ORAL | Status: AC | PRN

## 2019-09-16 MED ORDER — ONDANSETRON HCL 4 MG/2ML IJ SOLN
INTRAMUSCULAR | Status: DC | PRN
  Administered 2019-09-16: 4 mg via INTRAVENOUS

## 2019-09-16 MED ORDER — FENTANYL CITRATE (PF) 100 MCG/2ML IJ SOLN
25.0000 ug | INTRAMUSCULAR | Status: DC | PRN
  Administered 2019-09-16 (×4): 25 ug via INTRAVENOUS

## 2019-09-16 MED ORDER — PROPOFOL 10 MG/ML IV BOLUS
INTRAVENOUS | Status: DC | PRN
  Administered 2019-09-16 (×2): 30 mg via INTRAVENOUS
  Administered 2019-09-16: 200 mg via INTRAVENOUS
  Administered 2019-09-16: 30 mg via INTRAVENOUS

## 2019-09-16 MED ORDER — PHENYLEPHRINE HCL (PRESSORS) 10 MG/ML IV SOLN
INTRAVENOUS | Status: AC
  Filled 2019-09-16: qty 1

## 2019-09-16 MED ORDER — EPHEDRINE SULFATE 50 MG/ML IJ SOLN
INTRAMUSCULAR | Status: AC
  Filled 2019-09-16: qty 1

## 2019-09-16 MED ORDER — LACTATED RINGERS IV SOLN
INTRAVENOUS | Status: DC
  Administered 2019-09-16 (×2): via INTRAVENOUS

## 2019-09-16 MED ORDER — FAMOTIDINE 20 MG PO TABS
ORAL_TABLET | ORAL | Status: AC
  Filled 2019-09-16: qty 1

## 2019-09-16 MED ORDER — OXYCODONE HCL 5 MG PO TABS
ORAL_TABLET | ORAL | Status: AC
  Administered 2019-09-16: 5 mg via ORAL
  Filled 2019-09-16: qty 1

## 2019-09-16 MED ORDER — ACETAMINOPHEN 10 MG/ML IV SOLN
INTRAVENOUS | Status: DC | PRN
  Administered 2019-09-16: 1000 mg via INTRAVENOUS

## 2019-09-16 MED ORDER — LIDOCAINE HCL (CARDIAC) PF 100 MG/5ML IV SOSY
PREFILLED_SYRINGE | INTRAVENOUS | Status: DC | PRN
  Administered 2019-09-16: 50 mg via INTRAVENOUS

## 2019-09-16 MED ORDER — FENTANYL CITRATE (PF) 100 MCG/2ML IJ SOLN
INTRAMUSCULAR | Status: AC
  Administered 2019-09-16: 25 ug via INTRAVENOUS
  Filled 2019-09-16: qty 2

## 2019-09-16 MED ORDER — FAMOTIDINE 20 MG PO TABS
20.0000 mg | ORAL_TABLET | Freq: Once | ORAL | Status: AC
  Administered 2019-09-16: 20 mg via ORAL

## 2019-09-16 MED ORDER — LIDOCAINE HCL (PF) 2 % IJ SOLN
INTRAMUSCULAR | Status: AC
  Filled 2019-09-16: qty 10

## 2019-09-16 MED ORDER — BELLADONNA ALKALOIDS-OPIUM 16.2-60 MG RE SUPP
1.0000 | Freq: Every day | RECTAL | Status: DC
  Administered 2019-09-16: 1 via RECTAL

## 2019-09-16 MED ORDER — SULFAMETHOXAZOLE-TRIMETHOPRIM 800-160 MG PO TABS: 1.0000 | ORAL_TABLET | Freq: Two times a day (BID) | ORAL | 0 refills | Status: AC

## 2019-09-16 MED ORDER — FENTANYL CITRATE (PF) 100 MCG/2ML IJ SOLN
INTRAMUSCULAR | Status: DC | PRN
  Administered 2019-09-16: 50 ug via INTRAVENOUS
  Administered 2019-09-16: 25 ug via INTRAVENOUS
  Administered 2019-09-16: 50 ug via INTRAVENOUS
  Administered 2019-09-16 (×3): 25 ug via INTRAVENOUS
  Administered 2019-09-16: 50 ug via INTRAVENOUS

## 2019-09-16 MED ORDER — ACETAMINOPHEN 10 MG/ML IV SOLN
INTRAVENOUS | Status: AC
  Filled 2019-09-16: qty 100

## 2019-09-16 MED ORDER — EPHEDRINE SULFATE 50 MG/ML IJ SOLN
INTRAMUSCULAR | Status: DC | PRN
  Administered 2019-09-16: 7.5 mg via INTRAVENOUS
  Administered 2019-09-16: 5 mg via INTRAVENOUS
  Administered 2019-09-16: 7.5 mg via INTRAVENOUS

## 2019-09-16 MED ORDER — PHENYLEPHRINE HCL (PRESSORS) 10 MG/ML IV SOLN
INTRAVENOUS | Status: DC | PRN
  Administered 2019-09-16 (×6): 100 ug via INTRAVENOUS

## 2019-09-16 MED ORDER — OXYCODONE HCL 5 MG PO TABS
5.0000 mg | ORAL_TABLET | Freq: Once | ORAL | Status: AC | PRN
  Administered 2019-09-16: 11:00:00 5 mg via ORAL

## 2019-09-16 MED ORDER — DEXAMETHASONE SODIUM PHOSPHATE 4 MG/ML IJ SOLN
INTRAMUSCULAR | Status: AC
  Filled 2019-09-16: qty 1

## 2019-09-16 MED ORDER — BELLADONNA ALKALOIDS-OPIUM 16.2-60 MG RE SUPP
RECTAL | Status: AC
  Filled 2019-09-16: qty 1

## 2019-09-16 MED ORDER — DEXAMETHASONE SODIUM PHOSPHATE 10 MG/ML IJ SOLN
INTRAMUSCULAR | Status: DC | PRN
  Administered 2019-09-16: 5 mg via INTRAVENOUS

## 2019-09-16 SURGICAL SUPPLY — 19 items
BAG DRAIN CYSTO-URO LG1000N (MISCELLANEOUS) ×3 IMPLANT
BASKET ZERO TIP 1.9FR (BASKET) IMPLANT
CATH COUDE FOLEY 2W 5CC 20FR (CATHETERS) ×3 IMPLANT
ELECT COAG BIPOLAR CYL 1.2MMM (ELECTROSURGICAL) ×3
ELECTRODE COAG BIPLR CYL 1.2MM (ELECTROSURGICAL) ×1 IMPLANT
GLOVE BIO SURGEON STRL SZ8 (GLOVE) ×3 IMPLANT
GOWN STRL REUS W/ TWL LRG LVL3 (GOWN DISPOSABLE) ×2 IMPLANT
GOWN STRL REUS W/ TWL XL LVL3 (GOWN DISPOSABLE) ×1 IMPLANT
GOWN STRL REUS W/TWL LRG LVL3 (GOWN DISPOSABLE) ×4
GOWN STRL REUS W/TWL XL LVL3 (GOWN DISPOSABLE) ×2
IV NS IRRIG 3000ML ARTHROMATIC (IV SOLUTION) ×6 IMPLANT
KIT PROBE TRILOGY 3.9X350 (MISCELLANEOUS) ×3 IMPLANT
KIT TURNOVER CYSTO (KITS) ×3 IMPLANT
PACK CYSTO AR (MISCELLANEOUS) ×3 IMPLANT
SET IRRIG Y TYPE TUR BLADDER L (SET/KITS/TRAYS/PACK) ×3 IMPLANT
SURGILUBE 2OZ TUBE FLIPTOP (MISCELLANEOUS) ×3 IMPLANT
SYRINGE IRR TOOMEY STRL 70CC (SYRINGE) ×3 IMPLANT
WATER STERILE IRR 1000ML POUR (IV SOLUTION) ×3 IMPLANT
WATER STERILE IRR 3000ML UROMA (IV SOLUTION) IMPLANT

## 2019-09-16 NOTE — Anesthesia Post-op Follow-up Note (Signed)
Anesthesia QCDR form completed.        

## 2019-09-16 NOTE — Anesthesia Preprocedure Evaluation (Addendum)
Anesthesia Evaluation  Patient identified by MRN, date of birth, ID band Patient awake    Reviewed: Allergy & Precautions, H&P , NPO status , Patient's Chart, lab work & pertinent test results  History of Anesthesia Complications Negative for: history of anesthetic complications  Airway Mallampati: II  TM Distance: >3 FB Neck ROM: full    Dental  (+) Missing, Chipped   Pulmonary neg pulmonary ROS, neg COPD, neg recent URI, Not current smoker,           Cardiovascular (-) angina(-) CAD, (-) Past MI and (-) Cardiac Stents negative cardio ROS  (-) dysrhythmias      Neuro/Psych negative neurological ROS  negative psych ROS   GI/Hepatic Neg liver ROS, GERD  Controlled,  Endo/Other  negative endocrine ROS  Renal/GU      Musculoskeletal   Abdominal   Peds  Hematology negative hematology ROS (+)   Anesthesia Other Findings Past Medical History: No date: BPH without obstruction/lower urinary tract symptoms No date: GERD (gastroesophageal reflux disease)  Past Surgical History: 2011: COLONOSCOPY     Comment:  tubular adenoma 08/07/2017: COLONOSCOPY WITH PROPOFOL; N/A     Comment:  Procedure: COLONOSCOPY WITH PROPOFOL;  Surgeon:               Skulskie, Martin U, MD;  Location: [facility] ENDOSCOPY;                Service: Endoscopy;  Laterality: N/A; 09/09/2019: CYSTOSCOPY WITH INSERTION OF UROLIFT; N/A     Comment:  Procedure: CYSTOSCOPY WITH INSERTION OF UROLIFT;                Surgeon: Stoioff, Scott C, MD;  Location: [facility] ORS;                Service: Urology;  Laterality: N/A; 09/09/2019: CYSTOSCOPY WITH LITHOLAPAXY; N/A     Comment:  Procedure: CYSTOSCOPY WITH LITHOLAPAXY;  Surgeon:               Stoioff, Scott C, MD;  Location: [facility] ORS;  Service:               Urology;  Laterality: N/A; No date: ESOPHAGOGASTRODUODENOSCOPY No date: TONSILLECTOMY  BMI    Body Mass Index: 35.73 kg/m       Reproductive/Obstetrics negative OB ROS                             Anesthesia Physical  Anesthesia Plan  ASA: II  Anesthesia Plan: General LMA   Post-op Pain Management:    Induction:   PONV Risk Score and Plan: Dexamethasone, Ondansetron and Treatment may vary due to age or medical condition  Airway Management Planned:   Additional Equipment:   Intra-op Plan:   Post-operative Plan:   Informed Consent: I have reviewed the patients History and Physical, chart, labs and discussed the procedure including the risks, benefits and alternatives for the proposed anesthesia with the patient or authorized representative who has indicated his/her understanding and acceptance.     Dental Advisory Given  Plan Discussed with: Anesthesiologist  Anesthesia Plan Comments:         Anesthesia Quick Evaluation  

## 2019-09-16 NOTE — Anesthesia Procedure Notes (Signed)
Procedure Name: LMA Insertion Date/Time: 09/16/2019 8:39 AM Performed by: Lowry Bowl, CRNA Pre-anesthesia Checklist: Patient identified, Suction available, Emergency Drugs available and Patient being monitored Patient Re-evaluated:Patient Re-evaluated prior to induction Oxygen Delivery Method: Circle system utilized Preoxygenation: Pre-oxygenation with 100% oxygen Induction Type: IV induction Ventilation: Mask ventilation without difficulty LMA: LMA inserted LMA Size: 5.0 Number of attempts: 1 Placement Confirmation: positive ETCO2 and breath sounds checked- equal and bilateral Tube secured with: Tape Dental Injury: Teeth and Oropharynx as per pre-operative assessment

## 2019-09-16 NOTE — Op Note (Signed)
Preoperative diagnosis:  1. Multiple bladder calculi  Postoperative diagnosis:  1. Multiple bladder calculi  Procedure: 1. Cystolitholapaxy-staged  Surgeon: Abbie Sons, MD  Anesthesia: General  Complications: None  Intraoperative findings: 5 smooth rounded bladder calculi each measuring ~1 cm  EBL: Minimal  Specimens: None  Indication: Joel Carter is a 78 y.o. patient with BPH and multiple bladder calculi.  On 11/17 he underwent staged cystolitholapaxy and UroLift placement.  He presents today for completion of cystolitholapaxy.  After reviewing the management options for treatment, he elected to proceed with the above surgical procedure(s). We have discussed the potential benefits and risks of the procedure, side effects of the proposed treatment, the likelihood of the patient achieving the goals of the procedure, and any potential problems that might occur during the procedure or recuperation. Informed consent has been obtained.  Description of procedure:  The patient was taken to the operating room and general anesthesia was induced.  The patient was placed in the dorsal lithotomy position, prepped and draped in the usual sterile fashion, and preoperative antibiotics were administered. A preoperative time-out was performed.   A Piranha sheath with visual obturator was lubricated and passed under direct vision.  Prostatic urethra remarkable for anterior channel from prior UroLift.  Bladder calculi were identified as described above.  The offset scope was then placed into the Piranha sheath.  The Trilogy lithotripter was then placed through the scope.  Cystolitholapaxy was then performed initially at 100% ultrasound and impact settings.  Once fragmented the impact settings and frequency were decreased.  Several fragments were irrigated from the bladder.  Once the calculi were completely fragmented and no residual fragments remained some oozing was noted from the bladder neck due  to scope trauma.  The offset scope and sheath were removed and replaced with a 27 French continuous flow resectoscope with rollerball.  Oozing vessels at the bladder neck were then cauterized.  Bladder was examined and hemostasis was adequate.  No residual fragments were identified.  The resectoscope was removed and a 20 [place] coud catheter was placed with return of clear effluent upon irrigation.  Plan: He will be discharged with an indwelling Foley catheter and will be instructed to remove in the morning or will come into the office to have removed if he is not comfortable removing.   Abbie Sons, M.D.

## 2019-09-16 NOTE — Discharge Instructions (Signed)
AMBULATORY SURGERY  DISCHARGE INSTRUCTIONS   1) The drugs that you were given will stay in your system until tomorrow so for the next 24 hours you should not:  A) Drive an automobile B) Make any legal decisions C) Drink any alcoholic beverage   2) You may resume regular meals tomorrow.  Today it is better to start with liquids and gradually work up to solid foods.  You may eat anything you prefer, but it is better to start with liquids, then soup and crackers, and gradually work up to solid foods.   3) Please notify your doctor immediately if you have any unusual bleeding, trouble breathing, redness and pain at the surgery site, drainage, fever, or pain not relieved by medication.    4) Additional Instructions:   Cystoscopy patient instructions  Following a cystoscopy, a catheter (a flexible rubber tube) is sometimes left in place to empty the bladder. This may cause some discomfort or a feeling that you need to urinate. Your doctor determines the period of time that the catheter will be left in place. You may have bloody urine for two to three days (Call your doctor if the amount of bleeding increases or does not subside).  You may pass blood clots in your urine, especially if you had a biopsy. It is not unusual to pass small blood clots and have some bloody urine a couple of weeks after your cystoscopy. Again, call your doctor if the bleeding does not subside. You may have: Dysuria (painful urination) Frequency (urinating often) Urgency (strong desire to urinate)  These symptoms are common especially if medicine is instilled into the bladder or a ureteral stent is placed. Avoiding alcohol and caffeine, such as coffee, tea, and chocolate, may help relieve these symptoms. Drink plenty of water, unless otherwise instructed. Your doctor may also prescribe an antibiotic or other medicine to reduce these symptoms.  Cystoscopy results are available soon after the procedure; biopsy  results usually take two to four days. Your doctor will discuss the results of your exam with you. Before you go home, you will be given specific instructions for follow-up care. Special Instructions:  1  If you are going home with a catheter in place do not take a tub bath until removed by your doctor.  2  You may resume your normal activities.  3  Do not drive or operate machinery if you are taking narcotic pain medicine.  4  Be sure to keep all follow-up appointments with your doctor.   5 Call Your Doctor If: The catheter is not draining  You have severe pain  You are unable to urinate  You have a fever over 101  You have severe bleeding              6 a prescription for an additional 3 days of Septra DS was sent to your pharmacy.     Please contact your physician with any problems or Same Day Surgery at [phone], Monday through Friday 6 am to 4 pm, or [place] at [facility] number at [phone].

## 2019-09-16 NOTE — Interval H&P Note (Signed)
History and Physical Interval Note: 78 y.o. male with BPH and large volume bladder calculi.  He is 1 week status post UroLift and stage cystolitholapaxy.  He presents today for completion of cystolitholapaxy.  He has no complaints.  Denies fever or chills.  09/16/2019 8:00 AM  Joel Carter  has presented today for surgery, with the diagnosis of BPH,Bladder Calculi.  The various methods of treatment have been discussed with the patient and family. After consideration of risks, benefits and other options for treatment, the patient has consented to  Procedure(s): CYSTOSCOPY WITH LITHOLAPAXY (N/A) as a surgical intervention.  The patient's history has been reviewed, patient examined, no change in status, stable for surgery.  I have reviewed the patient's chart and labs.  Questions were answered to the patient's satisfaction.     [place]

## 2019-09-16 NOTE — Transfer of Care (Signed)
Immediate Anesthesia Transfer of Care Note  Patient: Joel Carter  Procedure(s) Performed: CYSTOSCOPY WITH LITHOLAPAXY (N/A Bladder)  Patient Location: PACU  Anesthesia Type:General  Level of Consciousness: drowsy and patient cooperative  Airway & Oxygen Therapy: Patient Spontanous Breathing and Patient connected to face mask oxygen  Post-op Assessment: Report given to RN and Post -op Vital signs reviewed and stable  Post vital signs: Reviewed and stable  Last Vitals:  Vitals Value Taken Time  BP 124/69 09/16/19 1034  Temp 36.5 C 09/16/19 1034  Pulse 94 09/16/19 1039  Resp 17 09/16/19 1039  SpO2 100 % 09/16/19 1039  Vitals shown include unvalidated device data.  Last Pain:  Vitals:   09/16/19 0731  TempSrc: Oral  PainSc: 3          Complications: No apparent anesthesia complications

## 2019-09-16 NOTE — Progress Notes (Signed)
Ch visited pt in pre-op who is here for the removal of stones in his bladder. Pt had a positive affect and shared that he did not have any problems with anesthesia from his previous procedures and is hopeful to have a speedy recovery post-op. Ch allowed pt to share about the plans that were changed due to COVID for the holiday with his family. Ch provided active listening and words of encouragement for pt.

## 2019-09-17 ENCOUNTER — Encounter: Payer: Self-pay | Admitting: Urology

## 2019-09-17 NOTE — Anesthesia Postprocedure Evaluation (Signed)
Anesthesia Post Note  Patient: Joel Carter  Procedure(s) Performed: CYSTOSCOPY WITH LITHOLAPAXY (N/A Bladder)  Patient location during evaluation: PACU Anesthesia Type: General Level of consciousness: awake and alert Pain management: pain level controlled Vital Signs Assessment: post-procedure vital signs reviewed and stable Respiratory status: spontaneous breathing, nonlabored ventilation and respiratory function stable Cardiovascular status: blood pressure returned to baseline and stable Postop Assessment: no apparent nausea or vomiting Anesthetic complications: no     Last Vitals:  Vitals:   09/16/19 1130 09/16/19 1218  BP: (!) 153/67 127/66  Pulse: 94 87  Resp: 18 16  Temp: 36.5 C   SpO2: 100% 97%    Last Pain:  Vitals:   09/17/19 0826  TempSrc:   PainSc: 0-No pain                 Durenda Hurt

## 2019-10-21 ENCOUNTER — Other Ambulatory Visit: Payer: Self-pay

## 2019-10-21 ENCOUNTER — Encounter: Payer: Self-pay | Admitting: Urology

## 2019-10-21 ENCOUNTER — Ambulatory Visit: Payer: PPO | Admitting: Urology

## 2019-10-21 VITALS — BP 130/85 | HR 73 | Ht 68.0 in | Wt 220.0 lb

## 2019-10-21 DIAGNOSIS — N400 Enlarged prostate without lower urinary tract symptoms: Secondary | ICD-10-CM

## 2019-10-21 DIAGNOSIS — H9190 Unspecified hearing loss, unspecified ear: Secondary | ICD-10-CM

## 2019-10-21 DIAGNOSIS — Z9889 Other specified postprocedural states: Secondary | ICD-10-CM | POA: Diagnosis not present

## 2019-10-21 NOTE — Progress Notes (Signed)
10/21/2019 10:18 AM   [name] 01-07-1941 [id number]  Referring provider: [name], MD [facility] [place],  [place] [address]  Chief Complaint  Patient presents with  . Routine Post Op    HPI: 78 y.o. male presents for postop follow-up.  He was noted to have multiple bladder calculi on evaluation for hematuria and irritative voiding symptoms.  He underwent staged cystolitholapaxy on 11/17 and 11/24 along with a UroLift placement on 11/17.  After cath removal he states he is doing quite well.  He is voiding with an excellent stream.  His storage related voiding symptoms have resolved.  He has no complaints today except he states his wife has told him his hearing is much worse since his procedures.  IPSS completed today was 0/35 with a quality of life rated 0/6.   PMH: Past Medical History:  Diagnosis Date  . BPH without obstruction/lower urinary tract symptoms   . GERD (gastroesophageal reflux disease)     Surgical History: Past Surgical History:  Procedure Laterality Date  . COLONOSCOPY  2011   tubular adenoma  . COLONOSCOPY WITH PROPOFOL N/A 08/07/2017   Procedure: COLONOSCOPY WITH PROPOFOL;  Surgeon: Lollie Sails, MD;  Location: [facility] ENDOSCOPY;  Service: Endoscopy;  Laterality: N/A;  . CYSTOSCOPY WITH INSERTION OF UROLIFT N/A 09/09/2019   Procedure: CYSTOSCOPY WITH INSERTION OF UROLIFT;  Surgeon: Abbie Sons, MD;  Location: [facility] ORS;  Service: Urology;  Laterality: N/A;  . CYSTOSCOPY WITH LITHOLAPAXY N/A 09/09/2019   Procedure: CYSTOSCOPY WITH LITHOLAPAXY;  Surgeon: Abbie Sons, MD;  Location: [facility] ORS;  Service: Urology;  Laterality: N/A;  . CYSTOSCOPY WITH LITHOLAPAXY N/A 09/16/2019   Procedure: CYSTOSCOPY WITH LITHOLAPAXY;  Surgeon: Abbie Sons, MD;  Location: [facility] ORS;  Service: Urology;  Laterality: N/A;  . ESOPHAGOGASTRODUODENOSCOPY    . TONSILLECTOMY      Home Medications:  Allergies  as of 10/21/2019   No Known Allergies     Medication List       Accurate as of October 21, 2019 10:18 AM. If you have any questions, ask your nurse or doctor.        STOP taking these medications   finasteride 5 MG tablet Commonly known as: PROSCAR Stopped by: Abbie Sons, MD   HYDROcodone-acetaminophen 5-325 MG tablet Commonly known as: NORCO/VICODIN Stopped by: Abbie Sons, MD     TAKE these medications   multivitamin capsule Take 1 capsule by mouth daily.   tamsulosin 0.4 MG Caps capsule Commonly known as: FLOMAX Take 1 capsule (0.4 mg total) by mouth daily. What changed: when to take this   trospium 20 MG tablet Commonly known as: SANCTURA Take 1 tablet (20 mg total) by mouth 2 (two) times daily.       Allergies: No Known Allergies  Family History: Family History  Problem Relation Age of Onset  . Diabetes Other        multiple family members  . Dementia Mother     Social History:  reports that he has never smoked. He has never used smokeless tobacco. He reports previous alcohol use of about 4.0 standard drinks of alcohol per week. He reports that he does not use drugs.  ROS: UROLOGY Frequent Urination?: No Hard to postpone urination?: No Burning/pain with urination?: No Get up at night to urinate?: No Leakage of urine?: No Urine stream starts and stops?: No Trouble starting stream?: No Do you have to strain to  urinate?: No Blood in urine?: No Urinary tract infection?: No Sexually transmitted disease?: No Injury to kidneys or bladder?: No Painful intercourse?: No Weak stream?: No Erection problems?: No Penile pain?: No  Gastrointestinal Nausea?: No Vomiting?: No Indigestion/heartburn?: No Diarrhea?: No Constipation?: No  Constitutional Fever: No Night sweats?: No Weight loss?: No Fatigue?: No  Skin Skin rash/lesions?: No Itching?: No  Eyes Blurred vision?: No Double vision?: No  Ears/Nose/Throat Sore throat?:  No Sinus problems?: No  Hematologic/Lymphatic Swollen glands?: No Easy bruising?: No  Cardiovascular Leg swelling?: No Chest pain?: No  Respiratory Cough?: No Shortness of breath?: No  Endocrine Excessive thirst?: No  Musculoskeletal Back pain?: No Joint pain?: No  Neurological Headaches?: No Dizziness?: No  Psychologic Depression?: No Anxiety?: No  Physical Exam: BP 130/85 (BP Location: Left Arm, Patient Position: Sitting, Cuff Size: Normal)   Pulse 73   Ht 5\' 8"  (1.727 m)   Wt 220 lb (99.8 kg)   BMI 33.45 kg/m   Constitutional:  Alert and oriented, No acute distress. HEENT: [place] AT, moist mucus membranes.  Trachea midline, no masses. Cardiovascular: No clubbing, cyanosis, or edema. Respiratory: Normal respiratory effort, no increased work of breathing.    Assessment & Plan:   Doing well status post UroLift placement and stage cystolitholapaxy.  Recommend a follow-up visit 3 months for IPSS and KUB.  Will place ENT referral for his complains of hearing loss.   Abbie Sons, [facility] [address], [place],  [address] [phone]

## 2019-11-13 DIAGNOSIS — H6983 Other specified disorders of Eustachian tube, bilateral: Secondary | ICD-10-CM | POA: Diagnosis not present

## 2019-11-13 DIAGNOSIS — H903 Sensorineural hearing loss, bilateral: Secondary | ICD-10-CM | POA: Diagnosis not present

## 2019-12-01 DIAGNOSIS — H903 Sensorineural hearing loss, bilateral: Secondary | ICD-10-CM | POA: Diagnosis not present

## 2019-12-03 ENCOUNTER — Other Ambulatory Visit: Payer: Self-pay | Admitting: Urology

## 2019-12-03 DIAGNOSIS — N401 Enlarged prostate with lower urinary tract symptoms: Secondary | ICD-10-CM

## 2019-12-03 DIAGNOSIS — N138 Other obstructive and reflux uropathy: Secondary | ICD-10-CM

## 2020-01-20 NOTE — Progress Notes (Signed)
01/21/20 12:44 PM   Joel Carter [birth year] [id number]  Referring provider: [name], MD [facility] [place],  [place] [address]  Chief Complaint  Patient presents with  . Benign Prostatic Hypertrophy    HPI: 79 y.o. male returns today for a 3 month f/u with KUB.   -status post UroLift placement and staged cystolitholapaxy on 09/09/19 -Frequency increased along with intermittent weak stream sometimes  -No incontinence, pain, or gross hematuria  -Currently on finasteride  -KUB shows no stone however metallic densities overlying true bony pelvis  IPSS    Row Name 01/21/20 1000         International Prostate Symptom Score   How often have you had the sensation of not emptying your bladder?  About half the time     How often have you had to urinate less than every two hours?  Less than half the time     How often have you found you stopped and started again several times when you urinated?  About half the time     How often have you found it difficult to postpone urination?  Not at All     How often have you had a weak urinary stream?  About half the time     How often have you had to strain to start urination?  Not at All     How many times did you typically get up at night to urinate?  2 Times     Total IPSS Score  13       Quality of Life due to urinary symptoms   If you were to spend the rest of your life with your urinary condition just the way it is now how would you feel about that?  Mixed        Score:  1-7 Mild 8-19 Moderate 20-35 Severe  PMH: Past Medical History:  Diagnosis Date  . BPH without obstruction/lower urinary tract symptoms   . GERD (gastroesophageal reflux disease)     Surgical History: Past Surgical History:  Procedure Laterality Date  . COLONOSCOPY  2011   tubular adenoma  . COLONOSCOPY WITH PROPOFOL N/A 08/07/2017   Procedure: COLONOSCOPY WITH PROPOFOL;  Surgeon: Lollie Sails,  MD;  Location: [facility] ENDOSCOPY;  Service: Endoscopy;  Laterality: N/A;  . CYSTOSCOPY WITH INSERTION OF UROLIFT N/A 09/09/2019   Procedure: CYSTOSCOPY WITH INSERTION OF UROLIFT;  Surgeon: Abbie Sons, MD;  Location: [facility] ORS;  Service: Urology;  Laterality: N/A;  . CYSTOSCOPY WITH LITHOLAPAXY N/A 09/09/2019   Procedure: CYSTOSCOPY WITH LITHOLAPAXY;  Surgeon: Abbie Sons, MD;  Location: [facility] ORS;  Service: Urology;  Laterality: N/A;  . CYSTOSCOPY WITH LITHOLAPAXY N/A 09/16/2019   Procedure: CYSTOSCOPY WITH LITHOLAPAXY;  Surgeon: Abbie Sons, MD;  Location: [facility] ORS;  Service: Urology;  Laterality: N/A;  . ESOPHAGOGASTRODUODENOSCOPY    . TONSILLECTOMY      Home Medications:  Allergies as of 01/21/2020   No Known Allergies     Medication List       Accurate as of January 21, 2020 12:44 PM. If you have any questions, ask your nurse or doctor.        finasteride 5 MG tablet Commonly known as: PROSCAR Take 5 mg by mouth daily.   multivitamin capsule Take 1 capsule by mouth daily.   tamsulosin 0.4 MG Caps capsule Commonly known as: FLOMAX Take 1 capsule (0.4 mg total) by mouth daily. What changed:  when to take this   trospium 20 MG tablet Commonly known as: SANCTURA TAKE 1 TABLET BY MOUTH TWICE A DAY       Allergies: No Known Allergies  Family History: Family History  Problem Relation Age of Onset  . Diabetes Other        multiple family members  . Dementia Mother     Social History:  reports that he has never smoked. He has never used smokeless tobacco. He reports previous alcohol use of about 4.0 standard drinks of alcohol per week. He reports that he does not use drugs.   Physical Exam: BP (!) 141/84   Pulse 78   Ht 5\' 8"  (1.727 m)   Wt 235 lb (106.6 kg)   BMI 35.73 kg/m   Constitutional:  Alert and oriented, No acute distress. HEENT: [place] AT, moist mucus membranes.  Trachea midline, no masses. Cardiovascular: No clubbing, cyanosis, or  edema. Respiratory: Normal respiratory effort, no increased work of breathing. Skin: No rashes, bruises or suspicious lesions. Neurologic: Grossly intact, no focal deficits, moving all 4 extremities. Psychiatric: Normal mood and affect.  Pertinent Imaging: KUB reviewed. See epic.   Assessment & Plan:    1. BPH with LUTS Status post UroLift placement and staged  cystolitholapaxy on 09/09/19 KUB shows no stone however abnormal metallic densities overlying true bony pelvis  Will schedule cysto for further evaluation    Abbie Sons, MD  [facility] [address] [place], [place] [address] [phone]  I, Lucas Mallow, am acting as a scribe for Dr. Nicki Reaper C. Mykale Gandolfo,  I have reviewed the above documentation for accuracy and completeness, and I agree with the above.   Abbie Sons, MD

## 2020-01-21 ENCOUNTER — Ambulatory Visit
Admission: RE | Admit: 2020-01-21 | Discharge: 2020-01-21 | Disposition: A | Discharge status: Home / Self Care | Payer: PPO | Source: Ambulatory Visit | Attending: Urology | Admitting: Urology

## 2020-01-21 ENCOUNTER — Ambulatory Visit: Payer: PPO | Admitting: Urology

## 2020-01-21 ENCOUNTER — Other Ambulatory Visit: Payer: Self-pay

## 2020-01-21 ENCOUNTER — Encounter: Payer: Self-pay | Admitting: Urology

## 2020-01-21 VITALS — BP 141/84 | HR 78 | Ht 68.0 in | Wt 235.0 lb

## 2020-01-21 DIAGNOSIS — Z9889 Other specified postprocedural states: Secondary | ICD-10-CM

## 2020-01-21 DIAGNOSIS — N401 Enlarged prostate with lower urinary tract symptoms: Secondary | ICD-10-CM

## 2020-01-21 DIAGNOSIS — N210 Calculus in bladder: Secondary | ICD-10-CM | POA: Diagnosis not present

## 2020-02-03 DIAGNOSIS — H40003 Preglaucoma, unspecified, bilateral: Secondary | ICD-10-CM | POA: Diagnosis not present

## 2020-02-10 DIAGNOSIS — Z125 Encounter for screening for malignant neoplasm of prostate: Secondary | ICD-10-CM | POA: Diagnosis not present

## 2020-02-10 DIAGNOSIS — Z79899 Other long term (current) drug therapy: Secondary | ICD-10-CM | POA: Diagnosis not present

## 2020-02-17 DIAGNOSIS — Z0000 Encounter for general adult medical examination without abnormal findings: Secondary | ICD-10-CM | POA: Diagnosis not present

## 2020-02-17 DIAGNOSIS — Z125 Encounter for screening for malignant neoplasm of prostate: Secondary | ICD-10-CM | POA: Diagnosis not present

## 2020-02-17 DIAGNOSIS — E782 Mixed hyperlipidemia: Secondary | ICD-10-CM | POA: Diagnosis not present

## 2020-02-17 DIAGNOSIS — N400 Enlarged prostate without lower urinary tract symptoms: Secondary | ICD-10-CM | POA: Diagnosis not present

## 2020-02-17 DIAGNOSIS — E538 Deficiency of other specified B group vitamins: Secondary | ICD-10-CM | POA: Diagnosis not present

## 2020-02-18 ENCOUNTER — Telehealth: Payer: Self-pay | Admitting: Urology

## 2020-02-18 MED ORDER — DIAZEPAM 10 MG PO TABS: ORAL_TABLET | ORAL | 0 refills | Status: DC

## 2020-02-18 NOTE — Telephone Encounter (Signed)
Pt left vm he states Dr. Bernardo Heater told him he would give him a pill for Pain for his Cysto tomorrow he would like one called in please

## 2020-02-18 NOTE — Telephone Encounter (Signed)
rx sent. He will need a driver

## 2020-02-19 ENCOUNTER — Other Ambulatory Visit: Payer: Self-pay

## 2020-02-19 ENCOUNTER — Ambulatory Visit (INDEPENDENT_AMBULATORY_CARE_PROVIDER_SITE_OTHER): Payer: PPO | Admitting: Urology

## 2020-02-19 ENCOUNTER — Encounter: Payer: Self-pay | Admitting: Urology

## 2020-02-19 VITALS — BP 148/87 | HR 86 | Ht 68.0 in | Wt 234.0 lb

## 2020-02-19 DIAGNOSIS — N210 Calculus in bladder: Secondary | ICD-10-CM

## 2020-02-19 NOTE — Progress Notes (Signed)
   02/19/20  CC:  Chief Complaint  Patient presents with  . Cysto    HPI: 79 y.o. male status post staged cystolitholapaxy and UroLift November 2020.  On follow-up last month KUB showed no recurrent bladder calculi however there were metallic densities overlying the true bony pelvis.  He has mild urinary frequency but overall no bothersome lower urinary tract symptoms.  Blood pressure (!) 148/87, pulse 86, height 5\' 8"  (1.727 m), weight 234 lb (106.1 kg). NED. A&Ox3.   No respiratory distress   Abd soft, NT, ND Normal phallus with bilateral descended testicles  Cystoscopy Procedure Note  Patient identification was confirmed, informed consent was obtained, and patient was prepped using Betadine solution.  Lidocaine jelly was administered per urethral meatus.     Pre-Procedure: - Inspection reveals a normal caliber urethral meatus.  Procedure: The flexible cystoscope was introduced without difficulty - No urethral strictures/lesions are present. - Moderate lateral lobe enlargement prostate  - Normal bladder neck - Bilateral ureteral orifices identified - Bladder mucosa  reveals no ulcers, tumors, or lesions - No bladder stones - No trabeculation - No foreign bodies noted in the bladder or prostatic urethra  Retroflexion shows no foreign bodies or intravesical median lobe   Post-Procedure: - Patient tolerated the procedure well  Assessment/ Plan: Status post UroLift and cystolitholapaxy.  Currently satisfied with voiding pattern.  Follow-up 1 year.   Abbie Sons, MD

## 2020-02-19 NOTE — Telephone Encounter (Signed)
Notified patient as instructed, patient pleased. Discussed follow-up appointments, patient agrees  

## 2020-02-23 LAB — URINALYSIS, COMPLETE
Bilirubin, UA: NEGATIVE
Glucose, UA: NEGATIVE
Ketones, UA: NEGATIVE
Leukocytes,UA: NEGATIVE
Nitrite, UA: NEGATIVE
Protein,UA: NEGATIVE
RBC, UA: NEGATIVE
Specific Gravity, UA: 1.025 (ref 1.005–1.030)
Urobilinogen, Ur: 0.2 mg/dL (ref 0.2–1.0)
pH, UA: 5.5 (ref 5.0–7.5)

## 2020-02-23 LAB — MICROSCOPIC EXAMINATION
Bacteria, UA: NONE SEEN
RBC, Urine: NONE SEEN /hpf (ref 0–2)

## 2020-06-21 ENCOUNTER — Other Ambulatory Visit: Payer: Self-pay | Admitting: Urology

## 2020-06-21 DIAGNOSIS — N401 Enlarged prostate with lower urinary tract symptoms: Secondary | ICD-10-CM

## 2020-06-21 DIAGNOSIS — R972 Elevated prostate specific antigen [PSA]: Secondary | ICD-10-CM

## 2020-08-11 DIAGNOSIS — D225 Melanocytic nevi of trunk: Secondary | ICD-10-CM | POA: Diagnosis not present

## 2020-08-11 DIAGNOSIS — L570 Actinic keratosis: Secondary | ICD-10-CM | POA: Diagnosis not present

## 2020-08-11 DIAGNOSIS — X32XXXA Exposure to sunlight, initial encounter: Secondary | ICD-10-CM | POA: Diagnosis not present

## 2020-08-11 DIAGNOSIS — L820 Inflamed seborrheic keratosis: Secondary | ICD-10-CM | POA: Diagnosis not present

## 2020-08-11 DIAGNOSIS — L538 Other specified erythematous conditions: Secondary | ICD-10-CM | POA: Diagnosis not present

## 2021-01-11 DIAGNOSIS — H40053 Ocular hypertension, bilateral: Secondary | ICD-10-CM | POA: Diagnosis not present

## 2021-02-10 DIAGNOSIS — H2512 Age-related nuclear cataract, left eye: Secondary | ICD-10-CM | POA: Diagnosis not present

## 2021-02-18 ENCOUNTER — Ambulatory Visit: Payer: Self-pay | Admitting: Urology

## 2021-02-22 ENCOUNTER — Encounter: Payer: Self-pay | Admitting: Ophthalmology

## 2021-02-22 ENCOUNTER — Other Ambulatory Visit: Payer: Self-pay

## 2021-02-28 NOTE — Discharge Instructions (Signed)

## 2021-03-02 ENCOUNTER — Encounter
Admission: RE | Disposition: A | Discharge status: Home / Self Care | Payer: Self-pay | Source: Home / Self Care | Attending: Ophthalmology

## 2021-03-02 ENCOUNTER — Other Ambulatory Visit: Payer: Self-pay

## 2021-03-02 ENCOUNTER — Ambulatory Visit: Payer: PPO | Admitting: Anesthesiology

## 2021-03-02 ENCOUNTER — Ambulatory Visit
Admission: RE | Admit: 2021-03-02 | Discharge: 2021-03-02 | Disposition: A | Discharge status: Home / Self Care | Payer: PPO | Attending: Ophthalmology | Admitting: Ophthalmology

## 2021-03-02 ENCOUNTER — Encounter: Payer: Self-pay | Admitting: Ophthalmology

## 2021-03-02 DIAGNOSIS — H25812 Combined forms of age-related cataract, left eye: Secondary | ICD-10-CM | POA: Diagnosis not present

## 2021-03-02 DIAGNOSIS — H2512 Age-related nuclear cataract, left eye: Secondary | ICD-10-CM | POA: Diagnosis not present

## 2021-03-02 DIAGNOSIS — Z79899 Other long term (current) drug therapy: Secondary | ICD-10-CM | POA: Diagnosis not present

## 2021-03-02 HISTORY — PX: CATARACT EXTRACTION W/PHACO: SHX586

## 2021-03-02 HISTORY — DX: Presence of external hearing-aid: Z97.4

## 2021-03-02 SURGERY — PHACOEMULSIFICATION, CATARACT, WITH IOL INSERTION
Anesthesia: Monitor Anesthesia Care | Site: Eye | Laterality: Left

## 2021-03-02 MED ORDER — LACTATED RINGERS IV SOLN: INTRAVENOUS | Status: DC

## 2021-03-02 MED ORDER — FENTANYL CITRATE (PF) 100 MCG/2ML IJ SOLN
INTRAMUSCULAR | Status: DC | PRN
  Administered 2021-03-02: 50 ug via INTRAVENOUS

## 2021-03-02 MED ORDER — EPINEPHRINE PF 1 MG/ML IJ SOLN
INTRAOCULAR | Status: DC | PRN
  Administered 2021-03-02: 66 mL via OPHTHALMIC

## 2021-03-02 MED ORDER — TETRACAINE HCL 0.5 % OP SOLN
1.0000 [drp] | OPHTHALMIC | Status: DC | PRN
  Administered 2021-03-02 (×3): 1 [drp] via OPHTHALMIC

## 2021-03-02 MED ORDER — ARMC OPHTHALMIC DILATING DROPS
1.0000 "application " | OPHTHALMIC | Status: DC | PRN
  Administered 2021-03-02 (×3): 1 via OPHTHALMIC

## 2021-03-02 MED ORDER — MIDAZOLAM HCL 2 MG/2ML IJ SOLN
INTRAMUSCULAR | Status: DC | PRN
  Administered 2021-03-02: 2 mg via INTRAVENOUS

## 2021-03-02 MED ORDER — LIDOCAINE HCL (PF) 2 % IJ SOLN
INTRAOCULAR | Status: DC | PRN
  Administered 2021-03-02: 1 mL via INTRAMUSCULAR

## 2021-03-02 MED ORDER — CEFUROXIME OPHTHALMIC INJECTION 1 MG/0.1 ML
INJECTION | OPHTHALMIC | Status: DC | PRN
  Administered 2021-03-02: 1 mg via INTRACAMERAL

## 2021-03-02 MED ORDER — NA HYALUR & NA CHOND-NA HYALUR 0.4-0.35 ML IO KIT
PACK | INTRAOCULAR | Status: DC | PRN
  Administered 2021-03-02: 1 mL via INTRAOCULAR

## 2021-03-02 MED ORDER — BRIMONIDINE TARTRATE-TIMOLOL 0.2-0.5 % OP SOLN
OPHTHALMIC | Status: DC | PRN
  Administered 2021-03-02: 1 [drp] via OPHTHALMIC

## 2021-03-02 SURGICAL SUPPLY — 19 items
CANNULA ANT/CHMB 27GA (MISCELLANEOUS) ×2 IMPLANT
GLOVE SURG ENC TEXT LTX SZ7.5 (GLOVE) ×2 IMPLANT
GLOVE SURG TRIUMPH 8.0 PF LTX (GLOVE) ×2 IMPLANT
GOWN STRL REUS W/ TWL LRG LVL3 (GOWN DISPOSABLE) ×2 IMPLANT
GOWN STRL REUS W/TWL LRG LVL3 (GOWN DISPOSABLE) ×4
LENS IOL DIOP 18.5 (Intraocular Lens) ×2 IMPLANT
LENS IOL TECNIS MONO 18.5 (Intraocular Lens) ×1 IMPLANT
MARKER SKIN DUAL TIP RULER LAB (MISCELLANEOUS) ×2 IMPLANT
NEEDLE CAPSULORHEX 25GA (NEEDLE) ×2 IMPLANT
NEEDLE FILTER BLUNT 18X 1/2SAF (NEEDLE) ×2
NEEDLE FILTER BLUNT 18X1 1/2 (NEEDLE) ×2 IMPLANT
PACK CATARACT BRASINGTON (MISCELLANEOUS) ×2 IMPLANT
PACK EYE AFTER SURG (MISCELLANEOUS) ×2 IMPLANT
PACK OPTHALMIC (MISCELLANEOUS) ×2 IMPLANT
SOLUTION OPHTHALMIC SALT (MISCELLANEOUS) ×2 IMPLANT
SYR 3ML LL SCALE MARK (SYRINGE) ×4 IMPLANT
SYR TB 1ML LUER SLIP (SYRINGE) ×2 IMPLANT
WATER STERILE IRR 250ML POUR (IV SOLUTION) ×2 IMPLANT
WIPE NON LINTING 3.25X3.25 (MISCELLANEOUS) ×2 IMPLANT

## 2021-03-02 NOTE — Transfer of Care (Signed)
Immediate Anesthesia Transfer of Care Note  Patient: Joel Carter  Procedure(s) Performed: CATARACT EXTRACTION PHACO AND INTRAOCULAR LENS PLACEMENT (IOC) LEFT (Left Eye)  Patient Location: PACU  Anesthesia Type: MAC  Level of Consciousness: awake, alert  and patient cooperative  Airway and Oxygen Therapy: Patient Spontanous Breathing and Patient connected to supplemental oxygen  Post-op Assessment: Post-op Vital signs reviewed, Patient's Cardiovascular Status Stable, Respiratory Function Stable, Patent Airway and No signs of Nausea or vomiting  Post-op Vital Signs: Reviewed and stable  Complications: No complications documented.

## 2021-03-02 NOTE — Anesthesia Procedure Notes (Signed)
Procedure Name: MAC Performed by: Hermila Millis, CRNA Pre-anesthesia Checklist: Patient identified, Emergency Drugs available, Suction available, Timeout performed and Patient being monitored Patient Re-evaluated:Patient Re-evaluated prior to induction Oxygen Delivery Method: Nasal cannula Placement Confirmation: positive ETCO2       

## 2021-03-02 NOTE — Op Note (Signed)
OPERATIVE NOTE  MITHCELL SCHUMPERT [id number] 03/02/2021   PREOPERATIVE DIAGNOSIS:  Nuclear sclerotic cataract left eye. H25.12   POSTOPERATIVE DIAGNOSIS:    Nuclear sclerotic cataract left eye.     PROCEDURE:  Phacoemusification with posterior chamber intraocular lens placement of the left eye  Ultrasound time: Procedure(s) with comments: CATARACT EXTRACTION PHACO AND INTRAOCULAR LENS PLACEMENT (IOC) LEFT (Left) - CDE 11.87 01:17.0 minutes 15.4%  LENS:   Implant Name Type Inv. Item Serial No. Manufacturer Lot No. LRB No. Used Action  LENS IOL DIOP 18.5 - [id number] Intraocular Lens LENS IOL DIOP 18.5 [id number] [id number]   Left 1 Implanted      SURGEON:  Wyonia Hough, MD   ANESTHESIA:  Topical with tetracaine drops and 2% Xylocaine jelly, augmented with 1% preservative-free intracameral lidocaine.    COMPLICATIONS:  None.   DESCRIPTION OF PROCEDURE:  The patient was identified in the holding room and transported to the operating room and placed in the supine position under the operating microscope.  The left eye was identified as the operative eye and it was prepped and draped in the usual sterile ophthalmic fashion.   A 1 millimeter clear-corneal paracentesis was made at the 1:30 position.  0.5 ml of preservative-free 1% lidocaine was injected into the anterior chamber.  The anterior chamber was filled with Viscoat viscoelastic.  A 2.4 millimeter keratome was used to make a near-clear corneal incision at the 10:30 position.  .  A curvilinear capsulorrhexis was made with a cystotome and capsulorrhexis forceps.  Balanced salt solution was used to hydrodissect and hydrodelineate the nucleus.   Phacoemulsification was then used in stop and chop fashion to remove the lens nucleus and epinucleus.  The remaining cortex was then removed using the irrigation and aspiration handpiece. Provisc was then placed into the capsular bag to distend it for lens placement.  A lens was then  injected into the capsular bag.  The remaining viscoelastic was aspirated.   Wounds were hydrated with balanced salt solution.  The anterior chamber was inflated to a physiologic pressure with balanced salt solution.  No wound leaks were noted. Cefuroxime 0.1 ml of a 10mg /ml solution was injected into the anterior chamber for a dose of 1 mg of intracameral antibiotic at the completion of the case.   Timolol and Brimonidine drops were applied to the eye.  The patient was taken to the recovery room in stable condition without complications of anesthesia or surgery.  Shresta Risden 03/02/2021, 9:18 AM

## 2021-03-02 NOTE — Anesthesia Preprocedure Evaluation (Signed)
Anesthesia Evaluation  Patient identified by MRN, date of birth, ID band Patient awake    Reviewed: NPO status   History of Anesthesia Complications Negative for: history of anesthetic complications  Airway Mallampati: II  TM Distance: >3 FB Neck ROM: full    Dental no notable dental hx.    Pulmonary neg pulmonary ROS,    Pulmonary exam normal        Cardiovascular Exercise Tolerance: Good negative cardio ROS Normal cardiovascular exam     Neuro/Psych  hearing aid in both ears negative neurological ROS  negative psych ROS   GI/Hepatic Neg liver ROS, GERD  Controlled,  Endo/Other  Morbid obesity (bmi 35)  Renal/GU negative Renal ROS   bph negative genitourinary   Musculoskeletal   Abdominal   Peds  Hematology negative hematology ROS (+)   Anesthesia Other Findings Pcp:  Yevonne Pax, MD at 02/17/2020 ;  Reproductive/Obstetrics                             Anesthesia Physical Anesthesia Plan  ASA: II  Anesthesia Plan: MAC   Post-op Pain Management:    Induction:   PONV Risk Score and Plan: 1 and Midazolam  Airway Management Planned:   Additional Equipment:   Intra-op Plan:   Post-operative Plan:   Informed Consent: I have reviewed the patients History and Physical, chart, labs and discussed the procedure including the risks, benefits and alternatives for the proposed anesthesia with the patient or authorized representative who has indicated his/her understanding and acceptance.       Plan Discussed with: CRNA  Anesthesia Plan Comments:         Anesthesia Quick Evaluation

## 2021-03-02 NOTE — Anesthesia Postprocedure Evaluation (Signed)
Anesthesia Post Note  Patient: Joel Carter  Procedure(s) Performed: CATARACT EXTRACTION PHACO AND INTRAOCULAR LENS PLACEMENT (IOC) LEFT (Left Eye)     Patient location during evaluation: PACU Anesthesia Type: MAC Level of consciousness: awake and alert Pain management: pain level controlled Vital Signs Assessment: post-procedure vital signs reviewed and stable Respiratory status: spontaneous breathing, nonlabored ventilation, respiratory function stable and patient connected to nasal cannula oxygen Cardiovascular status: stable and blood pressure returned to baseline Postop Assessment: no apparent nausea or vomiting Anesthetic complications: no   No complications documented.  Fidel Levy

## 2021-03-02 NOTE — H&P (Signed)
Joel Carter   Primary Care Physician:  [name], MD Ophthalmologist: Dr. Leandrew Koyanagi  Pre-Procedure History & Physical: HPI:  Joel Carter is a 80 y.o. male here for ophthalmic surgery.   Past Medical History:  Diagnosis Date  . BPH without obstruction/lower urinary tract symptoms   . GERD (gastroesophageal reflux disease)   . Wears hearing aid in both ears    Has, doesn't usually wear    Past Surgical History:  Procedure Laterality Date  . COLONOSCOPY  2011   tubular adenoma  . COLONOSCOPY WITH PROPOFOL N/A 08/07/2017   Procedure: COLONOSCOPY WITH PROPOFOL;  Surgeon: Lollie Sails, MD;  Location: [facility] ENDOSCOPY;  Service: Endoscopy;  Laterality: N/A;  . CYSTOSCOPY WITH INSERTION OF UROLIFT N/A 09/09/2019   Procedure: CYSTOSCOPY WITH INSERTION OF UROLIFT;  Surgeon: Abbie Sons, MD;  Location: [facility] ORS;  Service: Urology;  Laterality: N/A;  . CYSTOSCOPY WITH LITHOLAPAXY N/A 09/09/2019   Procedure: CYSTOSCOPY WITH LITHOLAPAXY;  Surgeon: Abbie Sons, MD;  Location: [facility] ORS;  Service: Urology;  Laterality: N/A;  . CYSTOSCOPY WITH LITHOLAPAXY N/A 09/16/2019   Procedure: CYSTOSCOPY WITH LITHOLAPAXY;  Surgeon: Abbie Sons, MD;  Location: [facility] ORS;  Service: Urology;  Laterality: N/A;  . ESOPHAGOGASTRODUODENOSCOPY    . TONSILLECTOMY      Prior to Admission medications   Medication Sig Start Date End Date Taking? Authorizing Provider  B Complex Vitamins (B COMPLEX 1 PO) Take by mouth daily.   Yes [provider]  finasteride (PROSCAR) 5 MG tablet TAKE 1 TABLET BY MOUTH EVERY DAY 06/21/20  Yes Stoioff, Ronda Fairly, MD  Multiple Vitamin (MULTIVITAMIN) capsule Take 1 capsule by mouth daily.   Yes [provider]  tamsulosin (FLOMAX) 0.4 MG CAPS capsule Take 1 capsule (0.4 mg total) by mouth daily. Patient not taking: Reported on 02/22/2021 03/06/19   Abbie Sons, MD  trospium (SANCTURA) 20 MG tablet TAKE 1 TABLET BY MOUTH  TWICE A DAY Patient not taking: Reported on 02/22/2021 12/03/19   Abbie Sons, MD    Allergies as of 01/12/2021  . (No Known Allergies)    Family History  Problem Relation Age of Onset  . Diabetes Other        multiple family members  . Dementia Mother     Social History   Socioeconomic History  . Marital status: Married    Spouse name: Not on file  . Number of children: Not on file  . Years of education: Not on file  . Highest education level: Not on file  Occupational History  . Not on file  Tobacco Use  . Smoking status: Never Smoker  . Smokeless tobacco: Never Used  Vaping Use  . Vaping Use: Never used  Substance and Sexual Activity  . Alcohol use: Not Currently    Alcohol/week: 4.0 standard drinks    Types: 4 Standard drinks or equivalent per week  . Drug use: No  . Sexual activity: Yes    Birth control/protection: None  Other Topics Concern  . Not on file  Social History Narrative  . Not on file   Social Determinants of Health   Financial Resource Strain: Not on file  Food Insecurity: Not on file  Transportation Needs: Not on file  Physical Activity: Not on file  Stress: Not on file  Social Connections: Not on file  Intimate Partner Violence: Not on file    Review of Systems: See HPI, otherwise negative ROS  Physical  Exam: BP (!) 145/92   Pulse 64   Temp (!) 97.5 F (36.4 C) (Temporal)   Ht 5\' 8"  (1.727 m)   Wt 105.7 kg   SpO2 98%   BMI 35.43 kg/m  General:   Alert,  pleasant and cooperative in NAD Head:  Normocephalic and atraumatic. Lungs:  Clear to auscultation.    Heart:  Regular rate and rhythm.   Impression/Plan: Joel Carter is here for ophthalmic surgery.  Risks, benefits, limitations, and alternatives regarding ophthalmic surgery have been reviewed with the patient.  Questions have been answered.  All parties agreeable.   Leandrew Koyanagi, MD  03/02/2021, 8:11 AM

## 2021-03-03 ENCOUNTER — Encounter: Payer: Self-pay | Admitting: Ophthalmology

## 2021-03-15 DIAGNOSIS — L821 Other seborrheic keratosis: Secondary | ICD-10-CM | POA: Diagnosis not present

## 2021-03-15 DIAGNOSIS — L728 Other follicular cysts of the skin and subcutaneous tissue: Secondary | ICD-10-CM | POA: Diagnosis not present

## 2021-06-07 ENCOUNTER — Other Ambulatory Visit: Payer: Self-pay | Admitting: Urology

## 2021-06-07 DIAGNOSIS — R972 Elevated prostate specific antigen [PSA]: Secondary | ICD-10-CM

## 2021-06-07 DIAGNOSIS — N401 Enlarged prostate with lower urinary tract symptoms: Secondary | ICD-10-CM

## 2021-06-08 DIAGNOSIS — Z86010 Personal history of colonic polyps: Secondary | ICD-10-CM | POA: Diagnosis not present

## 2021-06-28 ENCOUNTER — Other Ambulatory Visit: Payer: Self-pay | Admitting: Urology

## 2021-06-28 DIAGNOSIS — R972 Elevated prostate specific antigen [PSA]: Secondary | ICD-10-CM

## 2021-06-28 DIAGNOSIS — N401 Enlarged prostate with lower urinary tract symptoms: Secondary | ICD-10-CM

## 2021-07-04 ENCOUNTER — Other Ambulatory Visit: Payer: Self-pay | Admitting: Family Medicine

## 2021-07-04 DIAGNOSIS — R972 Elevated prostate specific antigen [PSA]: Secondary | ICD-10-CM

## 2021-07-04 DIAGNOSIS — N401 Enlarged prostate with lower urinary tract symptoms: Secondary | ICD-10-CM

## 2021-07-04 MED ORDER — FINASTERIDE 5 MG PO TABS: 5.0000 mg | ORAL_TABLET | Freq: Every day | ORAL | 0 refills | Status: DC

## 2021-07-22 ENCOUNTER — Ambulatory Visit
Admission: RE | Admit: 2021-07-22 | Discharge: 2021-07-22 | Disposition: A | Discharge status: Home / Self Care | Payer: PPO | Source: Ambulatory Visit | Attending: Urology | Admitting: Urology

## 2021-07-22 ENCOUNTER — Other Ambulatory Visit: Payer: Self-pay

## 2021-07-22 ENCOUNTER — Ambulatory Visit: Payer: PPO | Admitting: Urology

## 2021-07-22 ENCOUNTER — Encounter: Payer: Self-pay | Admitting: Urology

## 2021-07-22 VITALS — BP 135/85 | HR 77 | Ht 68.0 in | Wt 235.0 lb

## 2021-07-22 DIAGNOSIS — Z0389 Encounter for observation for other suspected diseases and conditions ruled out: Secondary | ICD-10-CM | POA: Diagnosis not present

## 2021-07-22 DIAGNOSIS — N210 Calculus in bladder: Secondary | ICD-10-CM

## 2021-07-22 DIAGNOSIS — Z87442 Personal history of urinary calculi: Secondary | ICD-10-CM | POA: Diagnosis not present

## 2021-07-22 DIAGNOSIS — N401 Enlarged prostate with lower urinary tract symptoms: Secondary | ICD-10-CM | POA: Diagnosis not present

## 2021-07-22 DIAGNOSIS — I878 Other specified disorders of veins: Secondary | ICD-10-CM | POA: Diagnosis not present

## 2021-07-22 LAB — URINALYSIS, COMPLETE
Bilirubin, UA: NEGATIVE
Glucose, UA: NEGATIVE
Ketones, UA: NEGATIVE
Leukocytes,UA: NEGATIVE
Nitrite, UA: NEGATIVE
Protein,UA: NEGATIVE
RBC, UA: NEGATIVE
Specific Gravity, UA: 1.02 (ref 1.005–1.030)
Urobilinogen, Ur: 0.2 mg/dL (ref 0.2–1.0)
pH, UA: 6.5 (ref 5.0–7.5)

## 2021-07-22 LAB — MICROSCOPIC EXAMINATION
Bacteria, UA: NONE SEEN
RBC, Urine: NONE SEEN /hpf (ref 0–2)

## 2021-07-22 LAB — BLADDER SCAN AMB NON-IMAGING: Scan Result: 41

## 2021-07-22 MED ORDER — FINASTERIDE 5 MG PO TABS: 5.0000 mg | ORAL_TABLET | Freq: Every day | ORAL | 3 refills | Status: DC

## 2021-07-22 NOTE — Progress Notes (Signed)
07/22/2021 4:38 PM   Joel Carter 07-21-1941 [id number]  Referring provider: [name], MD [facility] [place],  [place] [address]  Chief Complaint  Patient presents with   Benign Prostatic Hypertrophy   Urologic history: 1.  BPH with LUTS UroLift 09/09/2019  2.  Bladder calculi Stage cystolitholapaxy 09/09/2019 and 09/16/2019   HPI: 80 y.o. male presents for follow-up.  Last office visit 02/19/2020 He has mild urinary frequency and occasional postvoid dribbling but no bothersome LUTS He has discontinued tamsulosin but remains on finasteride Denies dysuria, gross hematuria Denies flank, abdominal or pelvic pain   PMH: Past Medical History:  Diagnosis Date   BPH without obstruction/lower urinary tract symptoms    GERD (gastroesophageal reflux disease)    Wears hearing aid in both ears    Has, doesn't usually wear    Surgical History: Past Surgical History:  Procedure Laterality Date   CATARACT EXTRACTION W/PHACO Left 03/02/2021   Procedure: CATARACT EXTRACTION PHACO AND INTRAOCULAR LENS PLACEMENT ([place]) LEFT;  Surgeon: Leandrew Koyanagi, MD;  Location: [place];  Service: Ophthalmology;  Laterality: Left;  CDE 11.87 01:17.0 minutes 15.4%   COLONOSCOPY  2011   tubular adenoma   COLONOSCOPY WITH PROPOFOL N/A 08/07/2017   Procedure: COLONOSCOPY WITH PROPOFOL;  Surgeon: Lollie Sails, MD;  Location: [facility] ENDOSCOPY;  Service: Endoscopy;  Laterality: N/A;   CYSTOSCOPY WITH INSERTION OF UROLIFT N/A 09/09/2019   Procedure: CYSTOSCOPY WITH INSERTION OF UROLIFT;  Surgeon: Abbie Sons, MD;  Location: [facility] ORS;  Service: Urology;  Laterality: N/A;   CYSTOSCOPY WITH LITHOLAPAXY N/A 09/09/2019   Procedure: CYSTOSCOPY WITH LITHOLAPAXY;  Surgeon: Abbie Sons, MD;  Location: [facility] ORS;  Service: Urology;  Laterality: N/A;   CYSTOSCOPY WITH LITHOLAPAXY N/A 09/16/2019   Procedure: CYSTOSCOPY WITH  LITHOLAPAXY;  Surgeon: Abbie Sons, MD;  Location: [facility] ORS;  Service: Urology;  Laterality: N/A;   ESOPHAGOGASTRODUODENOSCOPY     TONSILLECTOMY      Home Medications:  Allergies as of 07/22/2021   No Known Allergies      Medication List        Accurate as of July 22, 2021  4:38 PM. If you have any questions, ask your nurse or doctor.          STOP taking these medications    tamsulosin 0.4 MG Caps capsule Commonly known as: FLOMAX Stopped by: Abbie Sons, MD   trospium 20 MG tablet Commonly known as: SANCTURA Stopped by: Abbie Sons, MD       TAKE these medications    B COMPLEX 1 PO Take by mouth daily.   finasteride 5 MG tablet Commonly known as: PROSCAR Take 1 tablet (5 mg total) by mouth daily.   multivitamin capsule Take 1 capsule by mouth daily.        Allergies: No Known Allergies  Family History: Family History  Problem Relation Age of Onset   Diabetes Other        multiple family members   Dementia Mother     Social History:  reports that he has never smoked. He has never used smokeless tobacco. He reports that he does not currently use alcohol after a past usage of about 4.0 standard drinks per week. He reports that he does not use drugs.   Physical Exam: BP 135/85   Pulse 77   Ht 5\' 8"  (1.727 m)   Wt 235 lb (106.6 kg)   BMI 35.73  kg/m   Constitutional:  Alert and oriented, No acute distress. HEENT: [place] AT, moist mucus membranes.  Trachea midline, no masses. Cardiovascular: No clubbing, cyanosis, or edema. Respiratory: Normal respiratory effort, no increased work of breathing.   Laboratory Data:  Urinalysis Dipstick/microscopy negative    Assessment & Plan:    1.  BPH with LUTS Doing well status post UroLift Bladder scan PVR 41 mL Continue annual follow-up  2.  History of bladder calculi Urinalysis clear KUB today   Abbie Sons, MD  [place] [address]  [place], [place] [address] [phone]

## 2021-07-25 ENCOUNTER — Telehealth: Payer: Self-pay | Admitting: *Deleted

## 2021-07-25 NOTE — Telephone Encounter (Signed)
Notified patient as instructed, patient pleased. Discussed follow-up appointments, patient agrees  

## 2021-07-25 NOTE — Telephone Encounter (Signed)
-----   Message from Abbie Sons, MD sent at 07/24/2021 12:51 PM EDT ----- KUB showed no evidence of bladder calculi

## 2021-09-12 ENCOUNTER — Ambulatory Visit: Payer: PPO | Admitting: Certified Registered Nurse Anesthetist

## 2021-09-12 ENCOUNTER — Encounter
Admission: RE | Disposition: A | Discharge status: Home / Self Care | Payer: Self-pay | Source: Ambulatory Visit | Attending: Gastroenterology

## 2021-09-12 ENCOUNTER — Other Ambulatory Visit: Payer: Self-pay

## 2021-09-12 ENCOUNTER — Ambulatory Visit
Admission: RE | Admit: 2021-09-12 | Discharge: 2021-09-12 | Disposition: A | Discharge status: Home / Self Care | Payer: PPO | Source: Ambulatory Visit | Attending: Gastroenterology | Admitting: Gastroenterology

## 2021-09-12 ENCOUNTER — Encounter: Payer: Self-pay | Admitting: *Deleted

## 2021-09-12 DIAGNOSIS — Z01818 Encounter for other preprocedural examination: Secondary | ICD-10-CM | POA: Insufficient documentation

## 2021-09-12 SURGERY — COLONOSCOPY WITH PROPOFOL
Anesthesia: General

## 2021-09-12 MED ORDER — SODIUM CHLORIDE 0.9 % IV SOLN: INTRAVENOUS | Status: DC

## 2021-09-12 NOTE — Anesthesia Preprocedure Evaluation (Signed)
Anesthesia Evaluation  Patient identified by MRN, date of birth, ID band Patient awake    Reviewed: Allergy & Precautions, H&P , NPO status , Patient's Chart, lab work & pertinent test results  History of Anesthesia Complications Negative for: history of anesthetic complications  Airway Mallampati: II  TM Distance: >3 FB Neck ROM: full    Dental  (+) Missing, Chipped   Pulmonary neg pulmonary ROS, neg COPD, neg recent URI, Not current smoker,           Cardiovascular (-) angina(-) CAD, (-) Past MI and (-) Cardiac Stents negative cardio ROS  (-) dysrhythmias      Neuro/Psych negative neurological ROS  negative psych ROS   GI/Hepatic Neg liver ROS, GERD  Controlled,  Endo/Other  negative endocrine ROS  Renal/GU      Musculoskeletal   Abdominal   Peds  Hematology negative hematology ROS (+)   Anesthesia Other Findings Past Medical History: No date: BPH without obstruction/lower urinary tract symptoms No date: GERD (gastroesophageal reflux disease)  Past Surgical History: 2011: COLONOSCOPY     Comment:  tubular adenoma 08/07/2017: COLONOSCOPY WITH PROPOFOL; N/A     Comment:  Procedure: COLONOSCOPY WITH PROPOFOL;  Surgeon:               Lollie Sails, MD;  Location: [facility] ENDOSCOPY;                Service: Endoscopy;  Laterality: N/A; 09/09/2019: CYSTOSCOPY WITH INSERTION OF UROLIFT; N/A     Comment:  Procedure: CYSTOSCOPY WITH INSERTION OF UROLIFT;                Surgeon: Abbie Sons, MD;  Location: [facility] ORS;                Service: Urology;  Laterality: N/A; 09/09/2019: CYSTOSCOPY WITH LITHOLAPAXY; N/A     Comment:  Procedure: CYSTOSCOPY WITH LITHOLAPAXY;  Surgeon:               Abbie Sons, MD;  Location: [facility] ORS;  Service:               Urology;  Laterality: N/A; No date: ESOPHAGOGASTRODUODENOSCOPY No date: TONSILLECTOMY  BMI    Body Mass Index: 35.73 kg/m       Reproductive/Obstetrics negative OB ROS                             Anesthesia Physical  Anesthesia Plan  ASA: II  Anesthesia Plan: General LMA   Post-op Pain Management:    Induction:   PONV Risk Score and Plan: Dexamethasone, Ondansetron and Treatment may vary due to age or medical condition  Airway Management Planned:   Additional Equipment:   Intra-op Plan:   Post-operative Plan:   Informed Consent: I have reviewed the patients History and Physical, chart, labs and discussed the procedure including the risks, benefits and alternatives for the proposed anesthesia with the patient or authorized representative who has indicated his/her understanding and acceptance.     Dental Advisory Given  Plan Discussed with: Anesthesiologist  Anesthesia Plan Comments:         Anesthesia Quick Evaluation

## 2021-10-21 DIAGNOSIS — H40003 Preglaucoma, unspecified, bilateral: Secondary | ICD-10-CM | POA: Diagnosis not present

## 2021-11-09 DIAGNOSIS — E782 Mixed hyperlipidemia: Secondary | ICD-10-CM | POA: Diagnosis not present

## 2021-11-09 DIAGNOSIS — Z0000 Encounter for general adult medical examination without abnormal findings: Secondary | ICD-10-CM | POA: Diagnosis not present

## 2021-11-09 DIAGNOSIS — Z125 Encounter for screening for malignant neoplasm of prostate: Secondary | ICD-10-CM | POA: Diagnosis not present

## 2021-11-09 DIAGNOSIS — R270 Ataxia, unspecified: Secondary | ICD-10-CM | POA: Diagnosis not present

## 2021-11-10 ENCOUNTER — Other Ambulatory Visit: Payer: Self-pay | Admitting: Internal Medicine

## 2021-11-10 DIAGNOSIS — R270 Ataxia, unspecified: Secondary | ICD-10-CM

## 2021-12-01 ENCOUNTER — Ambulatory Visit
Admission: RE | Admit: 2021-12-01 | Discharge: 2021-12-01 | Disposition: A | Discharge status: Home / Self Care | Payer: PPO | Source: Ambulatory Visit | Attending: Internal Medicine | Admitting: Internal Medicine

## 2021-12-01 ENCOUNTER — Other Ambulatory Visit: Payer: Self-pay

## 2021-12-01 DIAGNOSIS — R270 Ataxia, unspecified: Secondary | ICD-10-CM | POA: Diagnosis not present

## 2021-12-01 DIAGNOSIS — R2689 Other abnormalities of gait and mobility: Secondary | ICD-10-CM | POA: Diagnosis not present

## 2021-12-09 ENCOUNTER — Ambulatory Visit: Payer: PPO | Admitting: Anesthesiology

## 2021-12-09 ENCOUNTER — Encounter: Payer: Self-pay | Admitting: *Deleted

## 2021-12-09 ENCOUNTER — Other Ambulatory Visit: Payer: Self-pay

## 2021-12-09 ENCOUNTER — Encounter
Admission: RE | Disposition: A | Discharge status: Home / Self Care | Payer: Self-pay | Source: Ambulatory Visit | Attending: Gastroenterology

## 2021-12-09 ENCOUNTER — Ambulatory Visit
Admission: RE | Admit: 2021-12-09 | Discharge: 2021-12-09 | Disposition: A | Discharge status: Home / Self Care | Payer: PPO | Source: Ambulatory Visit | Attending: Gastroenterology | Admitting: Gastroenterology

## 2021-12-09 DIAGNOSIS — K635 Polyp of colon: Secondary | ICD-10-CM | POA: Diagnosis not present

## 2021-12-09 DIAGNOSIS — N400 Enlarged prostate without lower urinary tract symptoms: Secondary | ICD-10-CM | POA: Insufficient documentation

## 2021-12-09 DIAGNOSIS — K640 First degree hemorrhoids: Secondary | ICD-10-CM | POA: Insufficient documentation

## 2021-12-09 DIAGNOSIS — Z1211 Encounter for screening for malignant neoplasm of colon: Secondary | ICD-10-CM | POA: Insufficient documentation

## 2021-12-09 DIAGNOSIS — D123 Benign neoplasm of transverse colon: Secondary | ICD-10-CM | POA: Diagnosis not present

## 2021-12-09 DIAGNOSIS — K219 Gastro-esophageal reflux disease without esophagitis: Secondary | ICD-10-CM | POA: Insufficient documentation

## 2021-12-09 DIAGNOSIS — Z86010 Personal history of colonic polyps: Secondary | ICD-10-CM | POA: Diagnosis not present

## 2021-12-09 DIAGNOSIS — Z87891 Personal history of nicotine dependence: Secondary | ICD-10-CM | POA: Insufficient documentation

## 2021-12-09 DIAGNOSIS — K649 Unspecified hemorrhoids: Secondary | ICD-10-CM | POA: Diagnosis not present

## 2021-12-09 HISTORY — PX: COLONOSCOPY WITH PROPOFOL: SHX5780

## 2021-12-09 SURGERY — COLONOSCOPY WITH PROPOFOL
Anesthesia: General

## 2021-12-09 MED ORDER — PROPOFOL 500 MG/50ML IV EMUL
INTRAVENOUS | Status: DC | PRN
  Administered 2021-12-09: 125 ug/kg/min via INTRAVENOUS

## 2021-12-09 MED ORDER — LIDOCAINE HCL (CARDIAC) PF 100 MG/5ML IV SOSY
PREFILLED_SYRINGE | INTRAVENOUS | Status: DC | PRN
  Administered 2021-12-09: 100 mg via INTRAVENOUS

## 2021-12-09 MED ORDER — PROPOFOL 10 MG/ML IV BOLUS
INTRAVENOUS | Status: DC | PRN
  Administered 2021-12-09: 60 mg via INTRAVENOUS

## 2021-12-09 MED ORDER — SODIUM CHLORIDE 0.9 % IV SOLN: INTRAVENOUS | Status: DC

## 2021-12-09 NOTE — Transfer of Care (Signed)
Immediate Anesthesia Transfer of Care Note  Patient: Joel Carter  Procedure(s) Performed: COLONOSCOPY WITH PROPOFOL  Patient Location: PACU  Anesthesia Type:General  Level of Consciousness: awake  Airway & Oxygen Therapy: Patient Spontanous Breathing  Post-op Assessment: Report given to RN  Post vital signs: stable  Last Vitals:  Vitals Value Taken Time  BP    Temp    Pulse    Resp    SpO2      Last Pain:  Vitals:   12/09/21 0831  TempSrc: Temporal  PainSc: 0-No pain         Complications: No notable events documented.

## 2021-12-09 NOTE — Interval H&P Note (Signed)
History and Physical Interval Note:  12/09/2021 8:51 AM  Joel Carter  has presented today for surgery, with the diagnosis of PH Colonic Polyps.  The various methods of treatment have been discussed with the patient and family. After consideration of risks, benefits and other options for treatment, the patient has consented to  Procedure(s): COLONOSCOPY WITH PROPOFOL (N/A) as a surgical intervention.  The patient's history has been reviewed, patient examined, no change in status, stable for surgery.  I have reviewed the patient's chart and labs.  Questions were answered to the patient's satisfaction.     Lesly Rubenstein  Ok to proceed with colonoscopy

## 2021-12-09 NOTE — Op Note (Signed)
[facility] Patient Name: Joel Carter Procedure Date: 12/09/2021 8:46 AM MRN: [id number] Account #: [id number] Date of Birth: 21-Apr-1941 Admit Type: Outpatient Age: 81 Room: [facility] END[facility] ROOM 3 Gender: Male Note Status: Finalized Instrument Name: Jasper Riling [id number] Procedure:             Colonoscopy Indications:           Surveillance: Personal history of adenomatous polyps                         on last colonoscopy 5 years ago Providers:             Andrey Farmer MD, MD Medicines:             Monitored Anesthesia Care Complications:         No immediate complications. Estimated blood loss:                         Minimal. Procedure:             Pre-Anesthesia Assessment:                        - Prior to the procedure, a History and Physical was                         performed, and patient medications and allergies were                         reviewed. The patient is competent. The risks and                         benefits of the procedure and the sedation options and                         risks were discussed with the patient. All questions                         were answered and informed consent was obtained.                         Patient identification and proposed procedure were                         verified by the physician, the nurse, the                         anesthesiologist, the anesthetist and the technician                         in the endoscopy suite. Mental Status Examination:                         alert and oriented. Airway Examination: normal                         oropharyngeal airway and neck mobility. Respiratory                         Examination: clear to auscultation. CV Examination:  normal. Prophylactic Antibiotics: The patient does not                         require prophylactic antibiotics. Prior                         Anticoagulants: The patient has taken no previous                          anticoagulant or antiplatelet agents. ASA Grade                         Assessment: II - A patient with mild systemic disease.                         After reviewing the risks and benefits, the patient                         was deemed in satisfactory condition to undergo the                         procedure. The anesthesia plan was to use monitored                         anesthesia care (MAC). Immediately prior to                         administration of medications, the patient was                         re-assessed for adequacy to receive sedatives. The                         heart rate, respiratory rate, oxygen saturations,                         blood pressure, adequacy of pulmonary ventilation, and                         response to care were monitored throughout the                         procedure. The physical status of the patient was                         re-assessed after the procedure.                        After obtaining informed consent, the colonoscope was                         passed under direct vision. Throughout the procedure,                         the patient's blood pressure, pulse, and oxygen                         saturations were monitored continuously. The  Colonoscope was introduced through the anus and                         advanced to the the cecum, identified by appendiceal                         orifice and ileocecal valve. The colonoscopy was                         somewhat difficult due to a tortuous colon. The                         patient tolerated the procedure well. The quality of                         the bowel preparation was excellent. Findings:      The perianal and digital rectal examinations were normal.      Two sessile polyps were found in the distal transverse colon. The polyps       were 2 to 3 mm in size. These polyps were removed with a cold snare.       Resection and  retrieval were complete. Estimated blood loss was minimal.      Internal hemorrhoids were found during retroflexion. The hemorrhoids       were Grade I (internal hemorrhoids that do not prolapse).      The exam was otherwise without abnormality on direct and retroflexion       views. Impression:            - Two 2 to 3 mm polyps in the distal transverse colon,                         removed with a cold snare. Resected and retrieved.                        - Internal hemorrhoids.                        - The examination was otherwise normal on direct and                         retroflexion views. Recommendation:        - Discharge patient to home.                        - Resume previous diet.                        - Continue present medications.                        - Await pathology results.                        - Repeat colonoscopy is not recommended due to current                         age (79 years or older) for surveillance.                        -  Return to referring physician as previously                         scheduled. Procedure Code(s):     --- Professional ---                        [phone], Colonoscopy, flexible; with removal of                         tumor(s), polyp(s), or other lesion(s) by snare                         technique Diagnosis Code(s):     --- Professional ---                        Z86.010, Personal history of colonic polyps                        K63.5, Polyp of colon                        K64.0, First degree hemorrhoids CPT copyright 2019 American Medical Association. All rights reserved. The codes documented in this report are preliminary and upon coder review may  be revised to meet current compliance requirements. Andrey Farmer MD, MD 12/09/2021 9:23:57 AM Number of Addenda: 0 Note Initiated On: 12/09/2021 8:46 AM Scope Withdrawal Time: 0 hours 10 minutes 40 seconds  Total Procedure Duration: 0 hours 22 minutes 19 seconds  Estimated  Blood Loss:  Estimated blood loss was minimal.      [facility]

## 2021-12-09 NOTE — H&P (Signed)
Outpatient short stay form Pre-procedure 12/09/2021  Lesly Rubenstein, MD  Primary Physician: [name], MD  Reason for visit:  Surveillance Colonoscopy  History of present illness:    81 y/o gentleman with history of > 3 Ta's on last colonoscopy in 2018. No family history of GI malignancies. No blood thinners. No significant abdominal surgeries.    Current Facility-Administered Medications:    0.9 %  sodium chloride infusion, , Intravenous, Continuous, Londa Mackowski, Hilton Cork, MD, Last Rate: 20 mL/hr at 12/09/21 0833, New Bag at 12/09/21 0833  Medications Prior to Admission  Medication Sig Dispense Refill Last Dose   B Complex Vitamins (B COMPLEX 1 PO) Take by mouth daily.   12/05/2021   finasteride (PROSCAR) 5 MG tablet Take 1 tablet (5 mg total) by mouth daily. 90 tablet 3 12/06/2021   Multiple Vitamin (MULTIVITAMIN) capsule Take 1 capsule by mouth daily.   12/05/2021     No Known Allergies   Past Medical History:  Diagnosis Date   BPH without obstruction/lower urinary tract symptoms    GERD (gastroesophageal reflux disease)    Wears hearing aid in both ears    Has, doesn't usually wear    Review of systems:  Otherwise negative.    Physical Exam  Gen: Alert, oriented. Appears stated age.  HEENT: PERRLA. Lungs: No respiratory distress CV: RRR Abd: soft, benign, no masses Ext: No edema    Planned procedures: Proceed with colonoscopy. The patient understands the nature of the planned procedure, indications, risks, alternatives and potential complications including but not limited to bleeding, infection, perforation, damage to internal organs and possible oversedation/side effects from anesthesia. The patient agrees and gives consent to proceed.  Please refer to procedure notes for findings, recommendations and patient disposition/instructions.     Lesly Rubenstein, MD [facility]

## 2021-12-09 NOTE — Anesthesia Preprocedure Evaluation (Signed)
Anesthesia Evaluation  Patient identified by MRN, date of birth, ID band Patient awake    Reviewed: Allergy & Precautions, NPO status , Patient's Chart, lab work & pertinent test results  History of Anesthesia Complications Negative for: history of anesthetic complications  Airway Mallampati: III  TM Distance: >3 FB Neck ROM: Full    Dental no notable dental hx. (+) Teeth Intact   Pulmonary neg pulmonary ROS, neg sleep apnea, neg COPD, Patient abstained from smoking.Not current smoker,    Pulmonary exam normal breath sounds clear to auscultation       Cardiovascular Exercise Tolerance: Good METS(-) hypertension(-) CAD and (-) Past MI negative cardio ROS  (-) dysrhythmias  Rhythm:Regular Rate:Normal - Systolic murmurs    Neuro/Psych negative neurological ROS  negative psych ROS   GI/Hepatic GERD  ,(+)     (-) substance abuse  ,   Endo/Other  neg diabetes  Renal/GU negative Renal ROS     Musculoskeletal   Abdominal   Peds  Hematology   Anesthesia Other Findings Past Medical History: No date: BPH without obstruction/lower urinary tract symptoms No date: GERD (gastroesophageal reflux disease) No date: Wears hearing aid in both ears     Comment:  Has, doesn't usually wear  Reproductive/Obstetrics                             Anesthesia Physical Anesthesia Plan  ASA: 2  Anesthesia Plan: General   Post-op Pain Management: Minimal or no pain anticipated   Induction: Intravenous  PONV Risk Score and Plan: 2 and Propofol infusion, TIVA and Ondansetron  Airway Management Planned: Nasal Cannula  Additional Equipment: None  Intra-op Plan:   Post-operative Plan:   Informed Consent: I have reviewed the patients History and Physical, chart, labs and discussed the procedure including the risks, benefits and alternatives for the proposed anesthesia with the patient or authorized  representative who has indicated his/her understanding and acceptance.     Dental advisory given  Plan Discussed with: CRNA and Surgeon  Anesthesia Plan Comments: (Discussed risks of anesthesia with patient, including possibility of difficulty with spontaneous ventilation under anesthesia necessitating airway intervention, PONV, and rare risks such as cardiac or respiratory or neurological events, and allergic reactions. Discussed the role of CRNA in patient's perioperative care. Patient understands.)        Anesthesia Quick Evaluation

## 2021-12-09 NOTE — Anesthesia Postprocedure Evaluation (Signed)
Anesthesia Post Note  Patient: Joel Carter  Procedure(s) Performed: COLONOSCOPY WITH PROPOFOL  Patient location during evaluation: Endoscopy Anesthesia Type: General Level of consciousness: awake and alert Pain management: pain level controlled Vital Signs Assessment: post-procedure vital signs reviewed and stable Respiratory status: spontaneous breathing, nonlabored ventilation, respiratory function stable and patient connected to nasal cannula oxygen Cardiovascular status: blood pressure returned to baseline and stable Postop Assessment: no apparent nausea or vomiting Anesthetic complications: no   No notable events documented.   Last Vitals:  Vitals:   12/09/21 0935 12/09/21 0945  BP: 129/88 133/80  Pulse: 76 73  Resp: 17 15  Temp:    SpO2: 94% 95%    Last Pain:  Vitals:   12/09/21 0945  TempSrc:   PainSc: 0-No pain                 Arita Miss

## 2021-12-12 ENCOUNTER — Encounter: Payer: Self-pay | Admitting: Gastroenterology

## 2021-12-12 LAB — SURGICAL PATHOLOGY

## 2021-12-21 DIAGNOSIS — H2511 Age-related nuclear cataract, right eye: Secondary | ICD-10-CM | POA: Diagnosis not present

## 2021-12-26 ENCOUNTER — Encounter: Payer: Self-pay | Admitting: Ophthalmology

## 2022-01-02 NOTE — Discharge Instructions (Signed)

## 2022-01-04 ENCOUNTER — Encounter
Admission: RE | Disposition: A | Discharge status: Home / Self Care | Payer: Self-pay | Source: Ambulatory Visit | Attending: Ophthalmology

## 2022-01-04 ENCOUNTER — Ambulatory Visit: Payer: PPO | Admitting: Anesthesiology

## 2022-01-04 ENCOUNTER — Ambulatory Visit
Admission: RE | Admit: 2022-01-04 | Discharge: 2022-01-04 | Disposition: A | Discharge status: Home / Self Care | Payer: PPO | Source: Ambulatory Visit | Attending: Ophthalmology | Admitting: Ophthalmology

## 2022-01-04 ENCOUNTER — Other Ambulatory Visit: Payer: Self-pay

## 2022-01-04 ENCOUNTER — Encounter: Payer: Self-pay | Admitting: Ophthalmology

## 2022-01-04 DIAGNOSIS — H25811 Combined forms of age-related cataract, right eye: Secondary | ICD-10-CM | POA: Diagnosis not present

## 2022-01-04 DIAGNOSIS — Z6835 Body mass index (BMI) 35.0-35.9, adult: Secondary | ICD-10-CM | POA: Diagnosis not present

## 2022-01-04 DIAGNOSIS — K219 Gastro-esophageal reflux disease without esophagitis: Secondary | ICD-10-CM | POA: Insufficient documentation

## 2022-01-04 DIAGNOSIS — H2511 Age-related nuclear cataract, right eye: Secondary | ICD-10-CM | POA: Diagnosis not present

## 2022-01-04 HISTORY — DX: Unspecified osteoarthritis, unspecified site: M19.90

## 2022-01-04 SURGERY — PHACOEMULSIFICATION, CATARACT, WITH IOL INSERTION
Anesthesia: Monitor Anesthesia Care | Site: Eye | Laterality: Right

## 2022-01-04 MED ORDER — SIGHTPATH DOSE#1 BSS IO SOLN
INTRAOCULAR | Status: DC | PRN
  Administered 2022-01-04: 1 mL via INTRAMUSCULAR

## 2022-01-04 MED ORDER — ARMC OPHTHALMIC DILATING DROPS
1.0000 "application " | OPHTHALMIC | Status: DC | PRN
  Administered 2022-01-04 (×3): 1 via OPHTHALMIC

## 2022-01-04 MED ORDER — ACETAMINOPHEN 500 MG PO TABS: 1000.0000 mg | ORAL_TABLET | Freq: Once | ORAL | Status: DC | PRN

## 2022-01-04 MED ORDER — FENTANYL CITRATE (PF) 100 MCG/2ML IJ SOLN
INTRAMUSCULAR | Status: DC | PRN
  Administered 2022-01-04 (×2): 50 ug via INTRAVENOUS

## 2022-01-04 MED ORDER — BRIMONIDINE TARTRATE-TIMOLOL 0.2-0.5 % OP SOLN
OPHTHALMIC | Status: DC | PRN
  Administered 2022-01-04: 1 [drp] via OPHTHALMIC

## 2022-01-04 MED ORDER — TETRACAINE HCL 0.5 % OP SOLN
1.0000 [drp] | OPHTHALMIC | Status: DC | PRN
Start: 2022-01-04 — End: 2022-01-04
  Administered 2022-01-04 (×3): 1 [drp] via OPHTHALMIC

## 2022-01-04 MED ORDER — MIDAZOLAM HCL 2 MG/2ML IJ SOLN
INTRAMUSCULAR | Status: DC | PRN
  Administered 2022-01-04: 2 mg via INTRAVENOUS

## 2022-01-04 MED ORDER — SIGHTPATH DOSE#1 BSS IO SOLN
INTRAOCULAR | Status: DC | PRN
  Administered 2022-01-04: 15 mL

## 2022-01-04 MED ORDER — SIGHTPATH DOSE#1 NA HYALUR & NA CHOND-NA HYALUR IO KIT
PACK | INTRAOCULAR | Status: DC | PRN
  Administered 2022-01-04: 1 via OPHTHALMIC

## 2022-01-04 MED ORDER — LACTATED RINGERS IV SOLN: INTRAVENOUS | Status: DC

## 2022-01-04 MED ORDER — EPINEPHRINE PF 1 MG/ML IJ SOLN
INTRAMUSCULAR | Status: DC | PRN
  Administered 2022-01-04: 75 mL via OPHTHALMIC

## 2022-01-04 MED ORDER — ACETAMINOPHEN 160 MG/5ML PO SOLN: 975.0000 mg | Freq: Once | ORAL | Status: DC | PRN

## 2022-01-04 MED ORDER — ONDANSETRON HCL 4 MG/2ML IJ SOLN: 4.0000 mg | Freq: Once | INTRAMUSCULAR | Status: DC | PRN

## 2022-01-04 MED ORDER — CEFUROXIME OPHTHALMIC INJECTION 1 MG/0.1 ML
INJECTION | OPHTHALMIC | Status: DC | PRN
Start: 2022-01-04 — End: 2022-01-04
  Administered 2022-01-04: 1 mg via OPHTHALMIC

## 2022-01-04 SURGICAL SUPPLY — 11 items
CATARACT SUITE SIGHTPATH (MISCELLANEOUS) ×2 IMPLANT
FEE CATARACT SUITE SIGHTPATH (MISCELLANEOUS) ×1 IMPLANT
GLOVE SRG 8 PF TXTR STRL LF DI (GLOVE) ×1 IMPLANT
GLOVE SURG ENC TEXT LTX SZ7.5 (GLOVE) ×2 IMPLANT
GLOVE SURG UNDER POLY LF SZ8 (GLOVE) ×2
LENS IOL TECNIS EYHANCE 19.0 (Intraocular Lens) ×1 IMPLANT
NDL FILTER BLUNT 18X1 1/2 (NEEDLE) ×1 IMPLANT
NEEDLE FILTER BLUNT 18X 1/2SAF (NEEDLE) ×1
NEEDLE FILTER BLUNT 18X1 1/2 (NEEDLE) ×1 IMPLANT
SYR 3ML LL SCALE MARK (SYRINGE) ×2 IMPLANT
WATER STERILE IRR 250ML POUR (IV SOLUTION) ×2 IMPLANT

## 2022-01-04 NOTE — H&P (Signed)
?[facility]  ? ?Primary Care Physician:  [name], MD ?Ophthalmologist: Dr. Leandrew Koyanagi ? ?Pre-Procedure History & Physical: ?HPI:  Joel Carter is a 81 y.o. male here for ophthalmic surgery. ?  ?Past Medical History:  ?Diagnosis Date  ? Arthritis   ? BPH without obstruction/lower urinary tract symptoms   ? GERD (gastroesophageal reflux disease)   ? Wears hearing aid in both ears   ? Has, doesn't usually wear  ? ? ?Past Surgical History:  ?Procedure Laterality Date  ? CATARACT EXTRACTION W/PHACO Left 03/02/2021  ? Procedure: CATARACT EXTRACTION PHACO AND INTRAOCULAR LENS PLACEMENT ([place]) LEFT;  Surgeon: Leandrew Koyanagi, MD;  Location: [place];  Service: Ophthalmology;  Laterality: Left;  CDE 11.87 ?01:17.0 minutes ?15.4%  ? COLONOSCOPY  2011  ? tubular adenoma  ? COLONOSCOPY WITH PROPOFOL N/A 08/07/2017  ? Procedure: COLONOSCOPY WITH PROPOFOL;  Surgeon: Lollie Sails, MD;  Location: [facility] ENDOSCOPY;  Service: Endoscopy;  Laterality: N/A;  ? COLONOSCOPY WITH PROPOFOL N/A 12/09/2021  ? Procedure: COLONOSCOPY WITH PROPOFOL;  Surgeon: Lesly Rubenstein, MD;  Location: [facility] ENDOSCOPY;  Service: Endoscopy;  Laterality: N/A;  ? CYSTOSCOPY WITH INSERTION OF UROLIFT N/A 09/09/2019  ? Procedure: CYSTOSCOPY WITH INSERTION OF UROLIFT;  Surgeon: Abbie Sons, MD;  Location: [facility];  Service: Urology;  Laterality: N/A;  ? CYSTOSCOPY WITH LITHOLAPAXY N/A 09/09/2019  ? Procedure: CYSTOSCOPY WITH LITHOLAPAXY;  Surgeon: Abbie Sons, MD;  Location: [facility];  Service: Urology;  Laterality: N/A;  ? CYSTOSCOPY WITH LITHOLAPAXY N/A 09/16/2019  ? Procedure: CYSTOSCOPY WITH LITHOLAPAXY;  Surgeon: Abbie Sons, MD;  Location: [facility];  Service: Urology;  Laterality: N/A;  ? ESOPHAGOGASTRODUODENOSCOPY    ? TONSILLECTOMY    ? ? ?Prior to Admission medications   ?Medication Sig Start Date End Date Taking? Authorizing Provider  ?B Complex Vitamins (B COMPLEX 1 PO) Take by mouth  daily.   Yes [provider]  ?finasteride (PROSCAR) 5 MG tablet Take 1 tablet (5 mg total) by mouth daily. 07/22/21  Yes Stoioff, Ronda Fairly, MD  ?Multiple Vitamin (MULTIVITAMIN) capsule Take 1 capsule by mouth daily.   Yes [provider]  ? ? ?Allergies as of 10/26/2021  ? (No Known Allergies)  ? ? ?Family History  ?Problem Relation Age of Onset  ? Diabetes Other   ?     multiple family members  ? Dementia Mother   ? ? ?Social History  ? ?Socioeconomic History  ? Marital status: Married  ?  Spouse name: Not on file  ? Number of children: Not on file  ? Years of education: Not on file  ? Highest education level: Not on file  ?Occupational History  ? Not on file  ?Tobacco Use  ? Smoking status: Never  ? Smokeless tobacco: Never  ?Vaping Use  ? Vaping Use: Never used  ?Substance and Sexual Activity  ? Alcohol use: Not Currently  ?  Alcohol/week: 4.0 standard drinks  ?  Types: 4 Standard drinks or equivalent per week  ? Drug use: No  ? Sexual activity: Yes  ?  Birth control/protection: None  ?Other Topics Concern  ? Not on file  ?Social History Narrative  ? Not on file  ? ?Social Determinants of Health  ? ?Financial Resource Strain: Not on file  ?Food Insecurity: Not on file  ?Transportation Needs: Not on file  ?Physical Activity: Not on file  ?Stress: Not on file  ?Social Connections: Not on file  ?Intimate  Partner Violence: Not on file  ? ? ?Review of Systems: ?See HPI, otherwise negative ROS ? ?Physical Exam: ?BP 138/82   Pulse 72   Temp (!) 97.3 ?F (36.3 ?C) (Temporal)   Resp 18   Ht '5\' 8"'$  (1.727 m)   Wt 105.7 kg   SpO2 96%   BMI 35.43 kg/m?  ?General:   Alert,  pleasant and cooperative in NAD ?Head:  Normocephalic and atraumatic. ?Lungs:  Clear to auscultation.    ?Heart:  Regular rate and rhythm.  ? ?Impression/Plan: ?Joel Carter is here for ophthalmic surgery. ? ?Risks, benefits, limitations, and alternatives regarding ophthalmic surgery have been reviewed with the patient.  Questions  have been answered.  All parties agreeable. ? ? Leandrew Koyanagi, MD  01/04/2022, 8:14 AM ? ?

## 2022-01-04 NOTE — Anesthesia Postprocedure Evaluation (Signed)
Anesthesia Post Note ? ?Patient: Joel Carter ? ?Procedure(s) Performed: CATARACT EXTRACTION PHACO AND INTRAOCULAR LENS PLACEMENT ([facility]) RIGHT (Right: Eye) ? ? ?  ?Patient location during evaluation: PACU ?Anesthesia Type: MAC ?Level of consciousness: awake and alert ?Pain management: pain level controlled ?Vital Signs Assessment: post-procedure vital signs reviewed and stable ?Respiratory status: spontaneous breathing, nonlabored ventilation and respiratory function stable ?Cardiovascular status: stable and blood pressure returned to baseline ?Postop Assessment: no apparent nausea or vomiting ?Anesthetic complications: no ? ? ?No notable events documented. ? ?April Manson ? ? ? ? ? ?

## 2022-01-04 NOTE — Op Note (Signed)
LOCATION:  [place] ?  ?PREOPERATIVE DIAGNOSIS:    Nuclear sclerotic cataract right eye. H25.11 ?  ?POSTOPERATIVE DIAGNOSIS:  Nuclear sclerotic cataract right eye.   ?  ?PROCEDURE:  Phacoemusification with posterior chamber intraocular lens placement of the right eye  ? ?ULTRASOUND TIME: Procedure(s) with comments: ?CATARACT EXTRACTION PHACO AND INTRAOCULAR LENS PLACEMENT ([facility]) RIGHT (Right) - 6.71 ?01:14.6 ? ?LENS:   ?Implant Name Type Inv. Item Serial No. Manufacturer Lot No. LRB No. Used Action  ?LENS IOL TECNIS EYHANCE 19.0 - [id number] Intraocular Lens LENS IOL TECNIS EYHANCE 19.0 [id number] [employer]  Right 1 Implanted  ?   ?  ?  ?SURGEON:  Wyonia Hough, MD ?  ?ANESTHESIA:  Topical with tetracaine drops and 2% Xylocaine jelly, augmented with 1% preservative-free intracameral lidocaine. ? ?  ?COMPLICATIONS:  None. ?  ?DESCRIPTION OF PROCEDURE:  The patient was identified in the holding room and transported to the operating room and placed in the supine position under the operating microscope.  The right eye was identified as the operative eye and it was prepped and draped in the usual sterile ophthalmic fashion. ?  ?A 1 millimeter clear-corneal paracentesis was made at the 12:00 position.  0.5 ml of preservative-free 1% lidocaine was injected into the anterior chamber. ?The anterior chamber was filled with Viscoat viscoelastic.  A 2.4 millimeter keratome was used to make a near-clear corneal incision at the 9:00 position.  A curvilinear capsulorrhexis was made with a cystotome and capsulorrhexis forceps.  Balanced salt solution was used to hydrodissect and hydrodelineate the nucleus. ?  ?Phacoemulsification was then used in stop and chop fashion to remove the lens nucleus and epinucleus.  The remaining cortex was then removed using the irrigation and aspiration handpiece. Provisc was then placed into the capsular bag to distend it for lens placement.  A lens was then injected into the  capsular bag.  The remaining viscoelastic was aspirated. ?  ?Wounds were hydrated with balanced salt solution.  The anterior chamber was inflated to a physiologic pressure with balanced salt solution.  No wound leaks were noted. Cefuroxime 0.1 ml of a '10mg'$ /ml solution was injected into the anterior chamber for a dose of 1 mg of intracameral antibiotic at the completion of the case. ?  Timolol and Brimonidine drops were applied to the eye.  The patient was taken to the recovery room in stable condition without complications of anesthesia or surgery. ? ? ?Joel Carter ?01/04/2022, 9:08 AM ? ?

## 2022-01-04 NOTE — Anesthesia Preprocedure Evaluation (Signed)
Anesthesia Evaluation  ?Patient identified by MRN, date of birth, ID band ?Patient awake ? ? ? ?Reviewed: ?NPO status  ? ?History of Anesthesia Complications ?Negative for: history of anesthetic complications ? ?Airway ?Mallampati: II ? ?TM Distance: >3 FB ?Neck ROM: full ? ? ? Dental ?no notable dental hx. ? ?  ?Pulmonary ?neg pulmonary ROS,  ?  ?Pulmonary exam normal ? ? ? ? ? ? ? Cardiovascular ?Exercise Tolerance: Good ?negative cardio ROS ?Normal cardiovascular exam ? ? ?  ?Neuro/Psych ? hearing aid in both ears ?negative neurological ROS ? negative psych ROS  ? GI/Hepatic ?Neg liver ROS, GERD  Controlled,  ?Endo/Other  ?Morbid obesity (bmi 35) ? Renal/GU ?negative Renal ROS  ? ?bph ?negative genitourinary ?  ?Musculoskeletal ? ? Abdominal ?  ?Peds ? Hematology ?negative hematology ROS ?(+)   ?Anesthesia Other Findings ?Pcp:  Yevonne Pax, MD at 02/17/2020 ; ? Reproductive/Obstetrics ? ?  ? ? ? ? ? ? ? ? ? ? ? ? ? ?  ?  ? ? ? ? ? ? ? ? ?Anesthesia Physical ? ?Anesthesia Plan ? ?ASA: 2 ? ?Anesthesia Plan: MAC  ? ?Post-op Pain Management:   ? ?Induction:  ? ?PONV Risk Score and Plan: 1 and Midazolam, TIVA and Treatment may vary due to age or medical condition ? ?Airway Management Planned:  ? ?Additional Equipment:  ? ?Intra-op Plan:  ? ?Post-operative Plan:  ? ?Informed Consent: I have reviewed the patients History and Physical, chart, labs and discussed the procedure including the risks, benefits and alternatives for the proposed anesthesia with the patient or authorized representative who has indicated his/her understanding and acceptance.  ? ? ? ? ? ?Plan Discussed with: CRNA ? ?Anesthesia Plan Comments:   ? ? ? ? ? ? ?Anesthesia Quick Evaluation ? ?

## 2022-01-04 NOTE — Transfer of Care (Signed)
Immediate Anesthesia Transfer of Care Note ? ?Patient: Joel Carter ? ?Procedure(s) Performed: CATARACT EXTRACTION PHACO AND INTRAOCULAR LENS PLACEMENT ([facility]) RIGHT (Right: Eye) ? ?Patient Location: PACU ? ?Anesthesia Type: MAC ? ?Level of Consciousness: awake, alert  and patient cooperative ? ?Airway and Oxygen Therapy: Patient Spontanous Breathing and Patient connected to supplemental oxygen ? ?Post-op Assessment: Post-op Vital signs reviewed, Patient's Cardiovascular Status Stable, Respiratory Function Stable, Patent Airway and No signs of Nausea or vomiting ? ?Post-op Vital Signs: Reviewed and stable ? ?Complications: No notable events documented. ? ?

## 2022-01-05 ENCOUNTER — Encounter: Payer: Self-pay | Admitting: Ophthalmology

## 2022-03-15 DIAGNOSIS — D225 Melanocytic nevi of trunk: Secondary | ICD-10-CM | POA: Diagnosis not present

## 2022-03-15 DIAGNOSIS — L820 Inflamed seborrheic keratosis: Secondary | ICD-10-CM | POA: Diagnosis not present

## 2022-03-15 DIAGNOSIS — L538 Other specified erythematous conditions: Secondary | ICD-10-CM | POA: Diagnosis not present

## 2022-03-15 DIAGNOSIS — D2272 Melanocytic nevi of left lower limb, including hip: Secondary | ICD-10-CM | POA: Diagnosis not present

## 2022-03-15 DIAGNOSIS — D2271 Melanocytic nevi of right lower limb, including hip: Secondary | ICD-10-CM | POA: Diagnosis not present

## 2022-03-15 DIAGNOSIS — D2261 Melanocytic nevi of right upper limb, including shoulder: Secondary | ICD-10-CM | POA: Diagnosis not present

## 2022-03-15 DIAGNOSIS — L821 Other seborrheic keratosis: Secondary | ICD-10-CM | POA: Diagnosis not present

## 2022-03-15 DIAGNOSIS — R208 Other disturbances of skin sensation: Secondary | ICD-10-CM | POA: Diagnosis not present

## 2022-03-15 DIAGNOSIS — D2262 Melanocytic nevi of left upper limb, including shoulder: Secondary | ICD-10-CM | POA: Diagnosis not present

## 2022-07-04 DIAGNOSIS — L03031 Cellulitis of right toe: Secondary | ICD-10-CM | POA: Diagnosis not present

## 2022-07-18 DIAGNOSIS — L03031 Cellulitis of right toe: Secondary | ICD-10-CM | POA: Diagnosis not present

## 2022-07-24 ENCOUNTER — Encounter: Payer: Self-pay | Admitting: Urology

## 2022-07-24 ENCOUNTER — Ambulatory Visit: Payer: PPO | Admitting: Urology

## 2022-07-24 VITALS — BP 128/74 | HR 89 | Ht 72.0 in | Wt 220.0 lb

## 2022-07-24 DIAGNOSIS — N401 Enlarged prostate with lower urinary tract symptoms: Secondary | ICD-10-CM | POA: Diagnosis not present

## 2022-07-24 DIAGNOSIS — Z87442 Personal history of urinary calculi: Secondary | ICD-10-CM

## 2022-07-24 LAB — BLADDER SCAN AMB NON-IMAGING: Scan Result: 0

## 2022-07-24 MED ORDER — FINASTERIDE 5 MG PO TABS: 5.0000 mg | ORAL_TABLET | Freq: Every day | ORAL | 3 refills | Status: DC

## 2022-07-24 NOTE — Progress Notes (Signed)
07/24/2022 12:36 PM   [name] 11/28/1940 [id number]  Referring provider: [name], MD [facility] [place],  [place] [address]  Chief Complaint  Patient presents with   Benign Prostatic Hypertrophy   Urologic history: 1.  BPH with LUTS UroLift 09/09/2019 Elected to stay on finasteride  2.  Bladder calculi Staged cystolitholapaxy 09/09/2019 and 09/16/2019   HPI: 81 y.o. male presents for follow-up.  Doing well since last visit No bothersome LUTS Denies dysuria, gross hematuria Denies flank, abdominal or pelvic pain  PMH: Past Medical History:  Diagnosis Date   Arthritis    BPH without obstruction/lower urinary tract symptoms    GERD (gastroesophageal reflux disease)    Wears hearing aid in both ears    Has, doesn't usually wear    Surgical History: Past Surgical History:  Procedure Laterality Date   CATARACT EXTRACTION W/PHACO Left 03/02/2021   Procedure: CATARACT EXTRACTION PHACO AND INTRAOCULAR LENS PLACEMENT ([place]) LEFT;  Surgeon: Leandrew Koyanagi, MD;  Location: [place];  Service: Ophthalmology;  Laterality: Left;  CDE 11.87 01:17.0 minutes 15.4%   CATARACT EXTRACTION W/PHACO Right 01/04/2022   Procedure: CATARACT EXTRACTION PHACO AND INTRAOCULAR LENS PLACEMENT ([place]) RIGHT;  Surgeon: Leandrew Koyanagi, MD;  Location: [place];  Service: Ophthalmology;  Laterality: Right;  6.71 01:14.6   COLONOSCOPY  2011   tubular adenoma   COLONOSCOPY WITH PROPOFOL N/A 08/07/2017   Procedure: COLONOSCOPY WITH PROPOFOL;  Surgeon: Lollie Sails, MD;  Location: [facility] ENDOSCOPY;  Service: Endoscopy;  Laterality: N/A;   COLONOSCOPY WITH PROPOFOL N/A 12/09/2021   Procedure: COLONOSCOPY WITH PROPOFOL;  Surgeon: Lesly Rubenstein, MD;  Location: [facility] ENDOSCOPY;  Service: Endoscopy;  Laterality: N/A;   CYSTOSCOPY WITH INSERTION OF UROLIFT N/A 09/09/2019   Procedure: CYSTOSCOPY WITH INSERTION  OF UROLIFT;  Surgeon: Abbie Sons, MD;  Location: [facility];  Service: Urology;  Laterality: N/A;   CYSTOSCOPY WITH LITHOLAPAXY N/A 09/09/2019   Procedure: CYSTOSCOPY WITH LITHOLAPAXY;  Surgeon: Abbie Sons, MD;  Location: [facility];  Service: Urology;  Laterality: N/A;   CYSTOSCOPY WITH LITHOLAPAXY N/A 09/16/2019   Procedure: CYSTOSCOPY WITH LITHOLAPAXY;  Surgeon: Abbie Sons, MD;  Location: [facility];  Service: Urology;  Laterality: N/A;   ESOPHAGOGASTRODUODENOSCOPY     TONSILLECTOMY      Home Medications:  Allergies as of 07/24/2022   No Known Allergies      Medication List        Accurate as of July 24, 2022 12:36 PM. If you have any questions, ask your nurse or doctor.          B COMPLEX 1 PO Take by mouth daily.   finasteride 5 MG tablet Commonly known as: PROSCAR Take 1 tablet (5 mg total) by mouth daily.   multivitamin capsule Take 1 capsule by mouth daily.        Allergies: No Known Allergies  Family History: Family History  Problem Relation Age of Onset   Diabetes Other        multiple family members   Dementia Mother     Social History:  reports that he has never smoked. He has never used smokeless tobacco. He reports that he does not currently use alcohol after a past usage of about 4.0 standard drinks of alcohol per week. He reports that he does not use drugs.   Physical Exam: BP 128/74   Pulse 89   Ht 6' (1.829 m)   [employer]  220 lb (99.8 kg)   BMI 29.84 kg/m   Constitutional:  Alert and oriented, No acute distress. HEENT: [place] AT, moist mucus membranes.  Trachea midline, no masses. Cardiovascular: No clubbing, cyanosis, or edema. Respiratory: Normal respiratory effort, no increased work of breathing.   Laboratory Data:  Urinalysis Dipstick/microscopy negative    Assessment & Plan:    1.  BPH with LUTS Doing well status post UroLift Bladder scan PVR 0 mL Finasteride refilled Continue annual follow-up  2.  History of  bladder calculi No symptoms to suggest recurrent bladder calculus KUB last year negative   Abbie Sons, MD  [facility] [address] [place], [place] [address] [phone]

## 2022-09-26 DIAGNOSIS — H40003 Preglaucoma, unspecified, bilateral: Secondary | ICD-10-CM | POA: Diagnosis not present

## 2022-11-10 DIAGNOSIS — M1611 Unilateral primary osteoarthritis, right hip: Secondary | ICD-10-CM | POA: Diagnosis not present

## 2022-11-10 DIAGNOSIS — Z0000 Encounter for general adult medical examination without abnormal findings: Secondary | ICD-10-CM | POA: Diagnosis not present

## 2022-11-10 DIAGNOSIS — Z125 Encounter for screening for malignant neoplasm of prostate: Secondary | ICD-10-CM | POA: Diagnosis not present

## 2022-11-10 DIAGNOSIS — Z23 Encounter for immunization: Secondary | ICD-10-CM | POA: Diagnosis not present

## 2022-11-10 DIAGNOSIS — E782 Mixed hyperlipidemia: Secondary | ICD-10-CM | POA: Diagnosis not present

## 2022-11-10 DIAGNOSIS — R739 Hyperglycemia, unspecified: Secondary | ICD-10-CM | POA: Diagnosis not present

## 2022-11-10 DIAGNOSIS — F330 Major depressive disorder, recurrent, mild: Secondary | ICD-10-CM | POA: Diagnosis not present

## 2022-11-22 DIAGNOSIS — L718 Other rosacea: Secondary | ICD-10-CM | POA: Diagnosis not present

## 2022-11-22 DIAGNOSIS — D485 Neoplasm of uncertain behavior of skin: Secondary | ICD-10-CM | POA: Diagnosis not present

## 2022-11-22 DIAGNOSIS — D0422 Carcinoma in situ of skin of left ear and external auricular canal: Secondary | ICD-10-CM | POA: Diagnosis not present

## 2022-11-27 DIAGNOSIS — G8929 Other chronic pain: Secondary | ICD-10-CM | POA: Diagnosis not present

## 2022-11-27 DIAGNOSIS — M25551 Pain in right hip: Secondary | ICD-10-CM | POA: Diagnosis not present

## 2022-11-27 DIAGNOSIS — M1611 Unilateral primary osteoarthritis, right hip: Secondary | ICD-10-CM | POA: Diagnosis not present

## 2023-01-16 DIAGNOSIS — D2322 Other benign neoplasm of skin of left ear and external auricular canal: Secondary | ICD-10-CM | POA: Diagnosis not present

## 2023-01-16 DIAGNOSIS — D0422 Carcinoma in situ of skin of left ear and external auricular canal: Secondary | ICD-10-CM | POA: Diagnosis not present

## 2023-01-16 DIAGNOSIS — L905 Scar conditions and fibrosis of skin: Secondary | ICD-10-CM | POA: Diagnosis not present

## 2023-02-22 IMAGING — CT CT HEAD W/O CM
4 series · 16 of 47 positions shown, 18 images · non-contrast
Comparison: MRI 05/21/2007

CLINICAL DATA: Balance disturbance.



[Series 2: head bone · axial · 0.43mm/px · z∈[-107,-77]mm · 3 of 77 slices shown]
[im 8/77  bone]
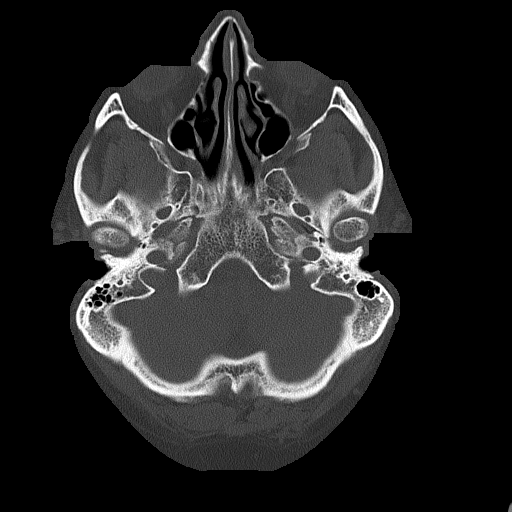
[im 16/77  bone]
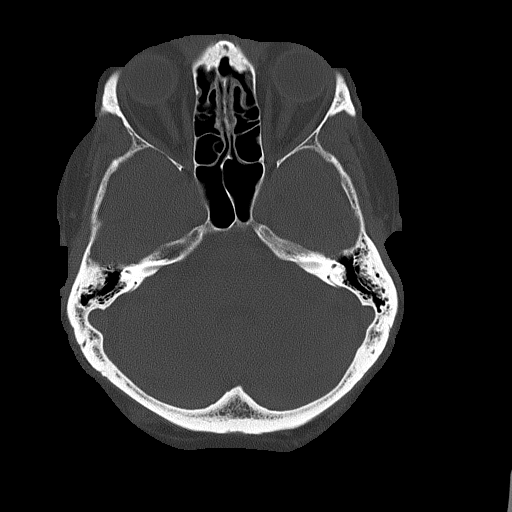
[im 23/77  bone]
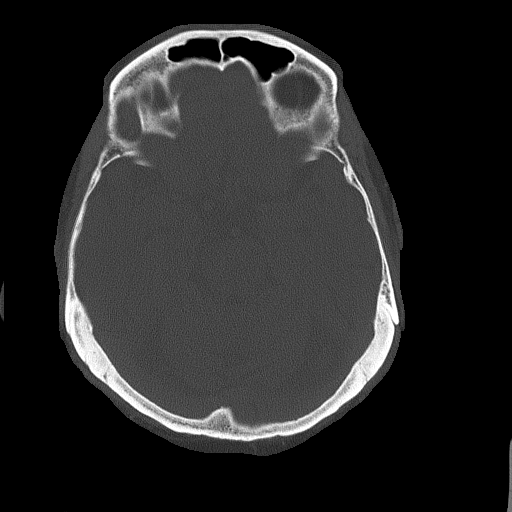

[Series 3: head wo · axial · 0.43mm/px · z∈[-106,+9]mm · 7 of 31 slices shown, 9 images]
[im 4/31  brain]
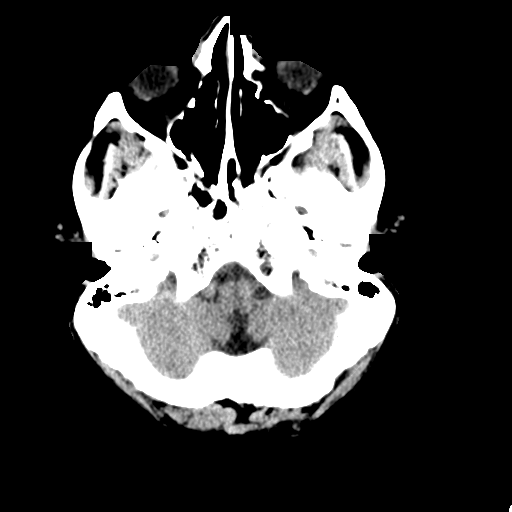
[im 4/31  bone]
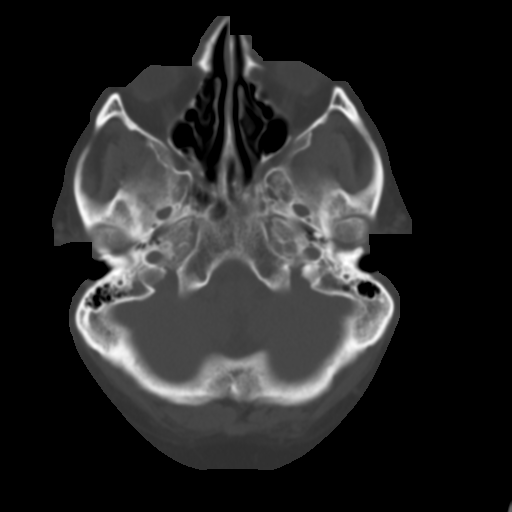
[im 8/31  brain]
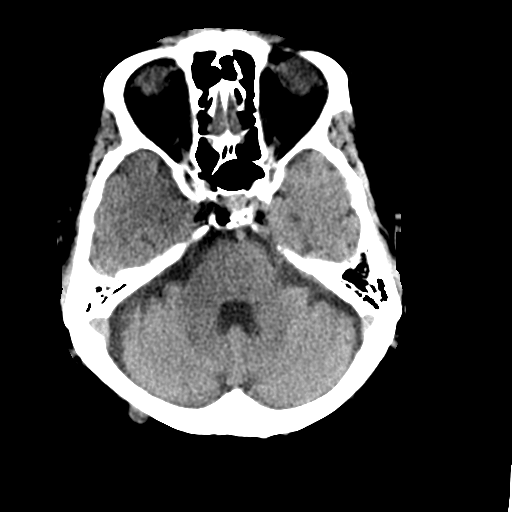
[im 12/31  brain]
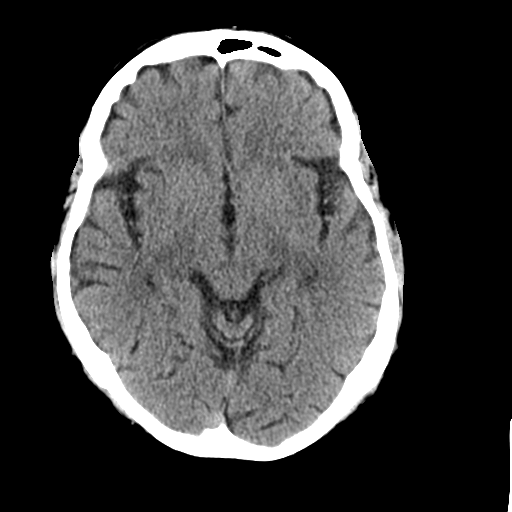
[im 16/31  brain]
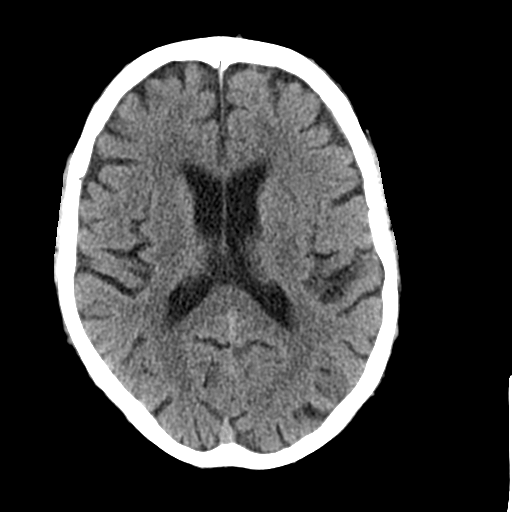
[im 19/31  brain]
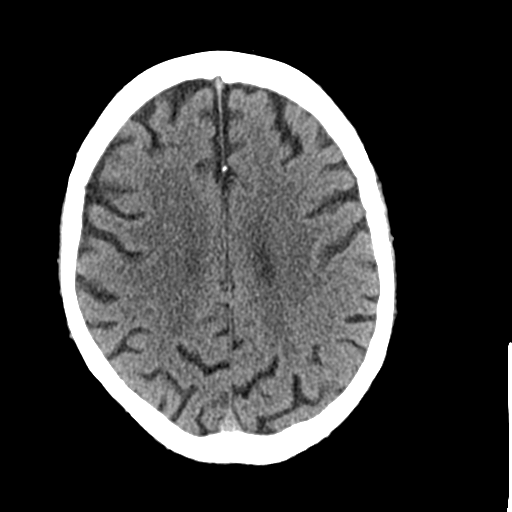
[im 19/31  bone]
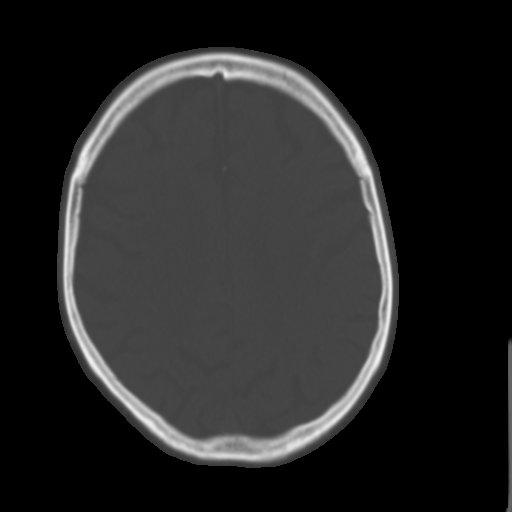
[im 23/31  brain]
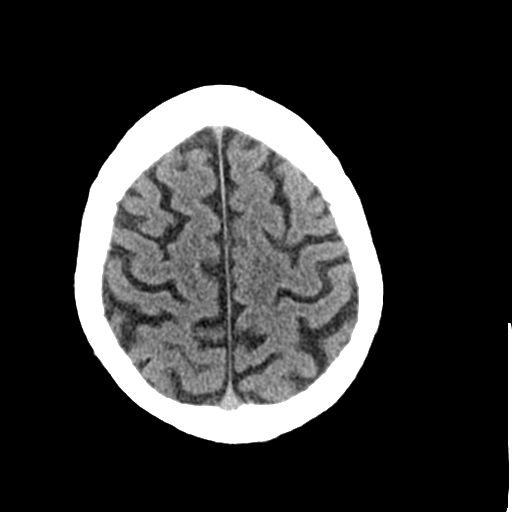
[im 27/31  brain]
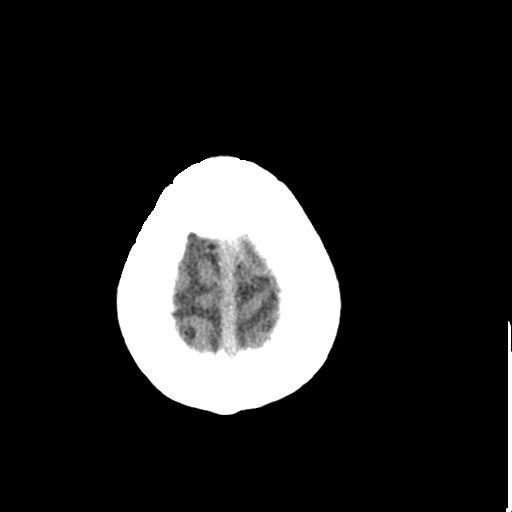

[Series 4: coronal soft tissue · coronal · 0.30mm/px · 3 of 72 slices shown]
[im 24/72  brain]
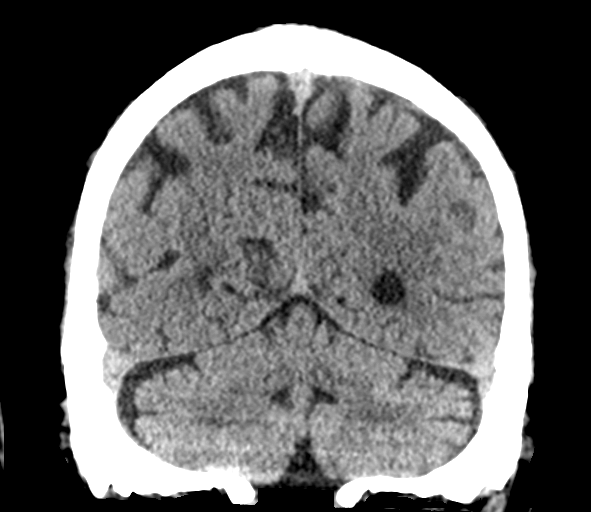
[im 32/72  brain]
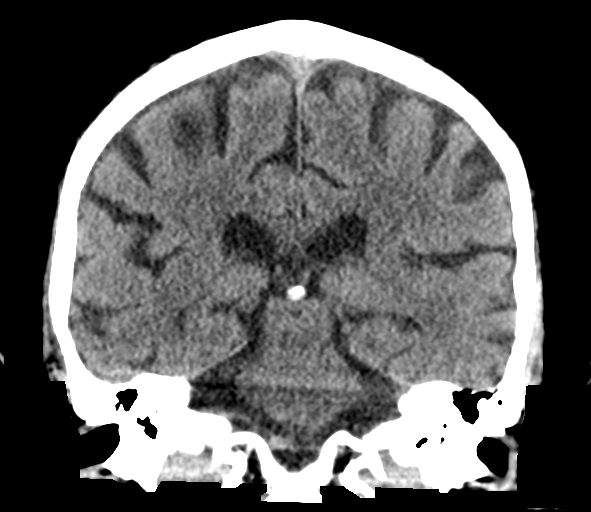
[im 40/72  brain]
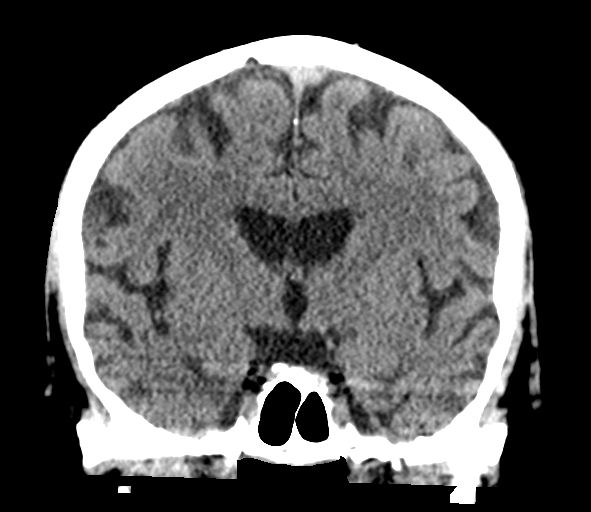

[Series 5: sagittal soft tissue · sagittal · 0.36mm/px · 3 of 60 slices shown]
[im 20/60  brain]
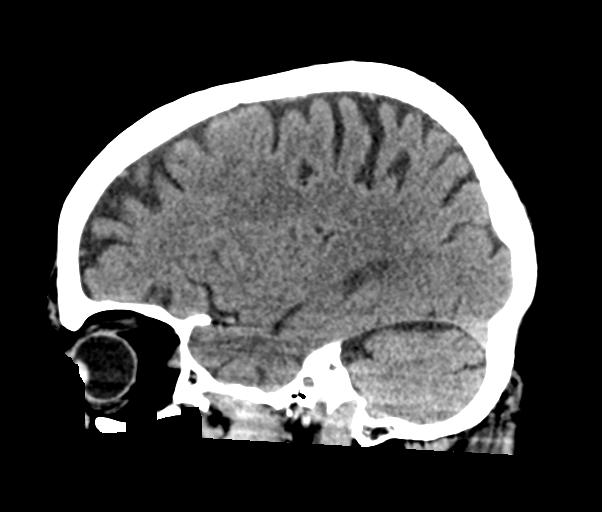
[im 30/60  brain]
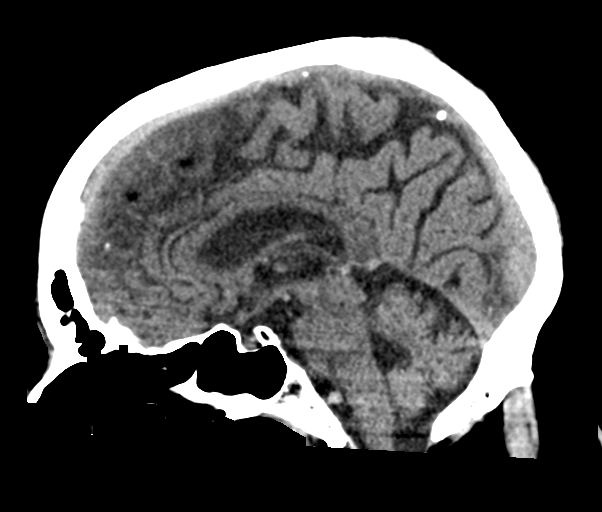
[im 40/60  brain]
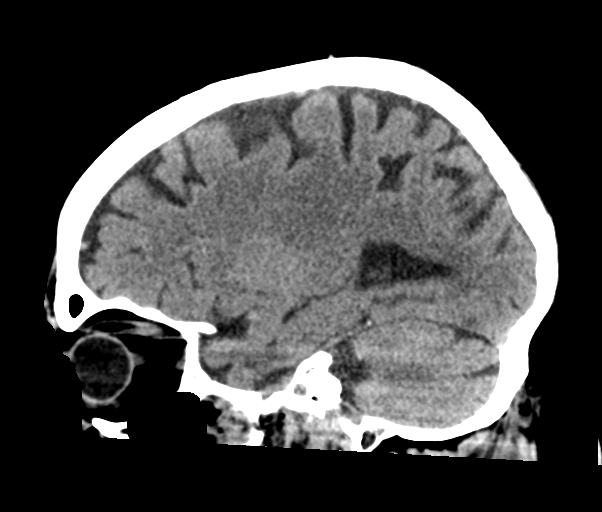

[16 of 47 positions shown; findings below may reference images not displayed]

FINDINGS: Brain: The brain shows a normal appearance without evidence of
malformation, unexpected atrophy, old or acute small or large vessel
infarction, mass lesion, hemorrhage, hydrocephalus or extra-axial
collection. There is mild generalized age related volume loss.

Vascular: No hyperdense vessel. No evidence of atherosclerotic
calcification.

Skull: Normal.  No traumatic finding.  No focal bone lesion.

Sinuses/Orbits: Sinuses are clear. Orbits appear normal. Mastoids
are clear.

Other: None significant
IMPRESSION: Mild generalized age related volume loss. No acute, focal or
reversible finding. No specific cause of the presenting symptoms is
identified.

## 2023-03-09 ENCOUNTER — Other Ambulatory Visit: Payer: Self-pay | Admitting: Physician Assistant

## 2023-03-09 ENCOUNTER — Ambulatory Visit
Admission: RE | Admit: 2023-03-09 | Discharge: 2023-03-09 | Disposition: A | Discharge status: Home / Self Care | Payer: PPO | Source: Ambulatory Visit | Attending: Physician Assistant | Admitting: Physician Assistant

## 2023-03-09 DIAGNOSIS — G459 Transient cerebral ischemic attack, unspecified: Secondary | ICD-10-CM | POA: Diagnosis not present

## 2023-03-09 DIAGNOSIS — R42 Dizziness and giddiness: Secondary | ICD-10-CM | POA: Insufficient documentation

## 2023-03-09 DIAGNOSIS — H66001 Acute suppurative otitis media without spontaneous rupture of ear drum, right ear: Secondary | ICD-10-CM | POA: Diagnosis not present

## 2023-03-14 DIAGNOSIS — L570 Actinic keratosis: Secondary | ICD-10-CM | POA: Diagnosis not present

## 2023-03-14 DIAGNOSIS — D2272 Melanocytic nevi of left lower limb, including hip: Secondary | ICD-10-CM | POA: Diagnosis not present

## 2023-03-14 DIAGNOSIS — D2271 Melanocytic nevi of right lower limb, including hip: Secondary | ICD-10-CM | POA: Diagnosis not present

## 2023-03-14 DIAGNOSIS — D2261 Melanocytic nevi of right upper limb, including shoulder: Secondary | ICD-10-CM | POA: Diagnosis not present

## 2023-03-14 DIAGNOSIS — D2262 Melanocytic nevi of left upper limb, including shoulder: Secondary | ICD-10-CM | POA: Diagnosis not present

## 2023-03-14 DIAGNOSIS — X32XXXA Exposure to sunlight, initial encounter: Secondary | ICD-10-CM | POA: Diagnosis not present

## 2023-03-14 DIAGNOSIS — D0461 Carcinoma in situ of skin of right upper limb, including shoulder: Secondary | ICD-10-CM | POA: Diagnosis not present

## 2023-03-14 DIAGNOSIS — L821 Other seborrheic keratosis: Secondary | ICD-10-CM | POA: Diagnosis not present

## 2023-03-14 DIAGNOSIS — D225 Melanocytic nevi of trunk: Secondary | ICD-10-CM | POA: Diagnosis not present

## 2023-03-14 DIAGNOSIS — D485 Neoplasm of uncertain behavior of skin: Secondary | ICD-10-CM | POA: Diagnosis not present

## 2023-03-26 DIAGNOSIS — Z961 Presence of intraocular lens: Secondary | ICD-10-CM | POA: Diagnosis not present

## 2023-03-26 DIAGNOSIS — H4912 Fourth [trochlear] nerve palsy, left eye: Secondary | ICD-10-CM | POA: Diagnosis not present

## 2023-03-26 DIAGNOSIS — H40003 Preglaucoma, unspecified, bilateral: Secondary | ICD-10-CM | POA: Diagnosis not present

## 2023-04-14 DIAGNOSIS — J028 Acute pharyngitis due to other specified organisms: Secondary | ICD-10-CM | POA: Diagnosis not present

## 2023-04-14 DIAGNOSIS — Z03818 Encounter for observation for suspected exposure to other biological agents ruled out: Secondary | ICD-10-CM | POA: Diagnosis not present

## 2023-05-10 ENCOUNTER — Other Ambulatory Visit: Payer: Self-pay | Admitting: *Deleted

## 2023-05-10 ENCOUNTER — Telehealth: Payer: Self-pay | Admitting: Urology

## 2023-05-10 DIAGNOSIS — N401 Enlarged prostate with lower urinary tract symptoms: Secondary | ICD-10-CM

## 2023-05-10 MED ORDER — FINASTERIDE 5 MG PO TABS: 5.0000 mg | ORAL_TABLET | Freq: Every day | ORAL | 3 refills | Status: DC

## 2023-05-10 NOTE — Telephone Encounter (Signed)
Medication was sent to [facility]

## 2023-05-10 NOTE — Telephone Encounter (Signed)
Patient called and stated that pharmacy needs authorization to refill Finasteride. [facility] [place].

## 2023-06-13 DIAGNOSIS — D0461 Carcinoma in situ of skin of right upper limb, including shoulder: Secondary | ICD-10-CM | POA: Diagnosis not present

## 2023-07-25 ENCOUNTER — Ambulatory Visit: Payer: Medicare HMO | Admitting: Urology

## 2023-07-25 ENCOUNTER — Encounter: Payer: Self-pay | Admitting: Urology

## 2023-07-25 VITALS — BP 113/77 | HR 139 | Ht 68.0 in | Wt 235.0 lb

## 2023-07-25 DIAGNOSIS — N401 Enlarged prostate with lower urinary tract symptoms: Secondary | ICD-10-CM

## 2023-07-25 MED ORDER — FINASTERIDE 5 MG PO TABS: 5.0000 mg | ORAL_TABLET | Freq: Every day | ORAL | 3 refills | Status: AC

## 2023-07-25 NOTE — Progress Notes (Signed)
I, Maysun Anabel Bene, acting as a scribe for Riki Altes, MD., have documented all relevant documentation on the behalf of Riki Altes, MD, as directed by Riki Altes, MD while in the presence of Riki Altes, MD.  07/25/2023 12:31 PM   Joel Carter January 28, 1941 [id number]  Referring provider: Danella Penton, MD [phone] [facility] [address] [facility] [place],  [place] [address]  Chief Complaint  Patient presents with   Benign Prostatic Hypertrophy   Urologic history: 1.  BPH with LUTS UroLift 09/09/2019 Elected to stay on finasteride   2.  Bladder calculi Staged cystolitholapaxy 09/09/2019 and 09/16/2019  HPI: Joel Carter is a 82 y.o. male presents for annual follow-up.   Doing well since last visit No bothersome LUTS Denies dysuria, gross hematuria Denies flank, abdominal or pelvic pain   PMH: Past Medical History:  Diagnosis Date   Arthritis    BPH without obstruction/lower urinary tract symptoms    GERD (gastroesophageal reflux disease)    Wears hearing aid in both ears    Has, doesn't usually wear    Surgical History: Past Surgical History:  Procedure Laterality Date   CATARACT EXTRACTION W/PHACO Left 03/02/2021   Procedure: CATARACT EXTRACTION PHACO AND INTRAOCULAR LENS PLACEMENT ([facility]) LEFT;  Surgeon: Lockie Mola, MD;  Location[facility];  Service: Ophthalmology;  Laterality: Left;  CDE 11.87 01:17.0 minutes 15.4%   CATARACT EXTRACTION W/PHACO Right 01/04/2022   Procedure: CATARACT EXTRACTION PHACO AND INTRAOCULAR LENS PLACEMENT ([facility]) RIGHT;  Surgeon: Lockie Mola, MD;  Location: [facility];  Service: Ophthalmology;  Laterality: Right;  6.71 01:14.6   COLONOSCOPY  2011   tubular adenoma   COLONOSCOPY WITH PROPOFOL N/A 08/07/2017   Procedure: COLONOSCOPY WITH PROPOFOL;  Surgeon: Christena Deem, MD;  Location: [facility]COPY;  Service: Endoscopy;  Laterality: N/A;   COLONOSCOPY WITH  PROPOFOL N/A 12/09/2021   Procedure: COLONOSCOPY WITH PROPOFOL;  Surgeon: Regis Bill, MD;  Location: [facility];  Service: Endoscopy;  Laterality: N/A;   CYSTOSCOPY WITH INSERTION OF UROLIFT N/A 09/09/2019   Procedure: CYSTOSCOPY WITH INSERTION OF UROLIFT;  Surgeon: Riki Altes, MD;  Location: [facility];  Service: Urology;  Laterality: N/A;   CYSTOSCOPY WITH LITHOLAPAXY N/A 09/09/2019   Procedure: CYSTOSCOPY WITH LITHOLAPAXY;  Surgeon: Riki Altes, MD;  Location: [facility];  Service: Urology;  Laterality: N/A;   CYSTOSCOPY WITH LITHOLAPAXY N/A 09/16/2019   Procedure: CYSTOSCOPY WITH LITHOLAPAXY;  Surgeon: Riki Altes, MD;  Location: [facility];  Service: Urology;  Laterality: N/A;   ESOPHAGOGASTRODUODENOSCOPY     TONSILLECTOMY      Home Medications:  Allergies as of 07/25/2023   No Known Allergies      Medication List        Accurate as of July 25, 2023 12:31 PM. If you have any questions, ask your nurse or doctor.          B COMPLEX 1 PO Take by mouth daily.   finasteride 5 MG tablet Commonly known as: PROSCAR Take 1 tablet (5 mg total) by mouth daily.   multivitamin capsule Take 1 capsule by mouth daily.        Allergies: No Known Allergies  Family History: Family History  Problem Relation Age of Onset   Diabetes Other        multiple family members   Dementia Mother     Social History:  reports that he has never smoked. He has never used  smokeless tobacco. He reports that he does not currently use alcohol after a past usage of about 4.0 standard drinks of alcohol per week. He reports that he does not use drugs.   Physical Exam: BP 113/77   Pulse (!) 139   Ht 5\' 8"  (1.727 m)   Wt 235 lb (106.6 kg)   BMI 35.73 kg/m   Constitutional:  Alert and oriented, No acute distress. HEENT: [place] AT Respiratory: Normal respiratory effort, no increased work of breathing. Psychiatric: Normal mood and affect.   Assessment & Plan:    1.  BPH  with LUTS Doing well status post UroLift Bladder scan PVR 24 mL Finasteride refilled Continue annual follow-up   I have reviewed the above documentation for accuracy and completeness, and I agree with the above.   Riki Altes, MD  [facility] [address], [address] [place], [place] [address] [phone]

## 2023-09-25 DIAGNOSIS — H40003 Preglaucoma, unspecified, bilateral: Secondary | ICD-10-CM | POA: Diagnosis not present

## 2023-10-02 DIAGNOSIS — H40003 Preglaucoma, unspecified, bilateral: Secondary | ICD-10-CM | POA: Diagnosis not present

## 2023-10-02 DIAGNOSIS — Z961 Presence of intraocular lens: Secondary | ICD-10-CM | POA: Diagnosis not present

## 2023-10-02 DIAGNOSIS — H26491 Other secondary cataract, right eye: Secondary | ICD-10-CM | POA: Diagnosis not present

## 2023-10-02 DIAGNOSIS — H4912 Fourth [trochlear] nerve palsy, left eye: Secondary | ICD-10-CM | POA: Diagnosis not present

## 2023-10-31 DIAGNOSIS — L820 Inflamed seborrheic keratosis: Secondary | ICD-10-CM | POA: Diagnosis not present

## 2023-10-31 DIAGNOSIS — D2261 Melanocytic nevi of right upper limb, including shoulder: Secondary | ICD-10-CM | POA: Diagnosis not present

## 2023-10-31 DIAGNOSIS — Z85828 Personal history of other malignant neoplasm of skin: Secondary | ICD-10-CM | POA: Diagnosis not present

## 2023-10-31 DIAGNOSIS — D225 Melanocytic nevi of trunk: Secondary | ICD-10-CM | POA: Diagnosis not present

## 2023-10-31 DIAGNOSIS — D2272 Melanocytic nevi of left lower limb, including hip: Secondary | ICD-10-CM | POA: Diagnosis not present

## 2023-10-31 DIAGNOSIS — D2262 Melanocytic nevi of left upper limb, including shoulder: Secondary | ICD-10-CM | POA: Diagnosis not present

## 2023-10-31 DIAGNOSIS — L538 Other specified erythematous conditions: Secondary | ICD-10-CM | POA: Diagnosis not present

## 2023-10-31 DIAGNOSIS — L570 Actinic keratosis: Secondary | ICD-10-CM | POA: Diagnosis not present

## 2023-11-09 DIAGNOSIS — Z125 Encounter for screening for malignant neoplasm of prostate: Secondary | ICD-10-CM | POA: Diagnosis not present

## 2023-11-09 DIAGNOSIS — R739 Hyperglycemia, unspecified: Secondary | ICD-10-CM | POA: Diagnosis not present

## 2023-11-09 DIAGNOSIS — E782 Mixed hyperlipidemia: Secondary | ICD-10-CM | POA: Diagnosis not present

## 2023-11-20 DIAGNOSIS — Z0000 Encounter for general adult medical examination without abnormal findings: Secondary | ICD-10-CM | POA: Diagnosis not present

## 2023-11-20 DIAGNOSIS — N400 Enlarged prostate without lower urinary tract symptoms: Secondary | ICD-10-CM | POA: Diagnosis not present

## 2023-11-20 DIAGNOSIS — Z125 Encounter for screening for malignant neoplasm of prostate: Secondary | ICD-10-CM | POA: Diagnosis not present

## 2023-11-20 DIAGNOSIS — Z1331 Encounter for screening for depression: Secondary | ICD-10-CM | POA: Diagnosis not present

## 2024-03-10 ENCOUNTER — Other Ambulatory Visit: Payer: Self-pay | Admitting: Family Medicine

## 2024-03-10 ENCOUNTER — Ambulatory Visit
Admission: RE | Admit: 2024-03-10 | Discharge: 2024-03-10 | Disposition: A | Discharge status: Home / Self Care | Source: Ambulatory Visit | Attending: Family Medicine | Admitting: Family Medicine

## 2024-03-10 DIAGNOSIS — M542 Cervicalgia: Secondary | ICD-10-CM | POA: Diagnosis not present

## 2024-03-10 DIAGNOSIS — W19XXXA Unspecified fall, initial encounter: Secondary | ICD-10-CM | POA: Insufficient documentation

## 2024-03-10 DIAGNOSIS — R053 Chronic cough: Secondary | ICD-10-CM | POA: Insufficient documentation

## 2024-03-10 DIAGNOSIS — R42 Dizziness and giddiness: Secondary | ICD-10-CM | POA: Diagnosis not present

## 2024-03-10 DIAGNOSIS — S0993XD Unspecified injury of face, subsequent encounter: Secondary | ICD-10-CM | POA: Diagnosis not present

## 2024-03-10 DIAGNOSIS — Y92009 Unspecified place in unspecified non-institutional (private) residence as the place of occurrence of the external cause: Secondary | ICD-10-CM | POA: Diagnosis not present

## 2024-03-10 DIAGNOSIS — S0993XA Unspecified injury of face, initial encounter: Secondary | ICD-10-CM

## 2024-03-10 DIAGNOSIS — M4802 Spinal stenosis, cervical region: Secondary | ICD-10-CM | POA: Diagnosis not present

## 2024-03-10 DIAGNOSIS — W010XXA Fall on same level from slipping, tripping and stumbling without subsequent striking against object, initial encounter: Secondary | ICD-10-CM | POA: Diagnosis not present

## 2024-03-10 DIAGNOSIS — W228XXA Striking against or struck by other objects, initial encounter: Secondary | ICD-10-CM | POA: Insufficient documentation

## 2024-03-10 DIAGNOSIS — S0990XA Unspecified injury of head, initial encounter: Secondary | ICD-10-CM | POA: Insufficient documentation

## 2024-03-10 DIAGNOSIS — Z043 Encounter for examination and observation following other accident: Secondary | ICD-10-CM | POA: Diagnosis not present

## 2024-03-10 DIAGNOSIS — R112 Nausea with vomiting, unspecified: Secondary | ICD-10-CM | POA: Diagnosis not present

## 2024-03-18 DIAGNOSIS — R42 Dizziness and giddiness: Secondary | ICD-10-CM | POA: Diagnosis not present

## 2024-03-18 DIAGNOSIS — J40 Bronchitis, not specified as acute or chronic: Secondary | ICD-10-CM | POA: Diagnosis not present

## 2024-03-18 DIAGNOSIS — S0181XD Laceration without foreign body of other part of head, subsequent encounter: Secondary | ICD-10-CM | POA: Diagnosis not present

## 2024-03-24 DIAGNOSIS — H6501 Acute serous otitis media, right ear: Secondary | ICD-10-CM | POA: Diagnosis not present

## 2024-03-24 DIAGNOSIS — H6063 Unspecified chronic otitis externa, bilateral: Secondary | ICD-10-CM | POA: Diagnosis not present

## 2024-03-24 DIAGNOSIS — H6122 Impacted cerumen, left ear: Secondary | ICD-10-CM | POA: Diagnosis not present

## 2024-03-24 DIAGNOSIS — R42 Dizziness and giddiness: Secondary | ICD-10-CM | POA: Diagnosis not present

## 2024-03-24 DIAGNOSIS — J301 Allergic rhinitis due to pollen: Secondary | ICD-10-CM | POA: Diagnosis not present

## 2024-03-25 DIAGNOSIS — H40003 Preglaucoma, unspecified, bilateral: Secondary | ICD-10-CM | POA: Diagnosis not present

## 2024-03-25 DIAGNOSIS — Z0100 Encounter for examination of eyes and vision without abnormal findings: Secondary | ICD-10-CM | POA: Diagnosis not present

## 2024-04-01 DIAGNOSIS — J810 Acute pulmonary edema: Secondary | ICD-10-CM | POA: Diagnosis not present

## 2024-04-18 DIAGNOSIS — H6981 Other specified disorders of Eustachian tube, right ear: Secondary | ICD-10-CM | POA: Diagnosis not present

## 2024-04-18 DIAGNOSIS — R42 Dizziness and giddiness: Secondary | ICD-10-CM | POA: Diagnosis not present

## 2024-05-16 ENCOUNTER — Encounter: Payer: Self-pay | Admitting: Urology

## 2024-07-23 ENCOUNTER — Ambulatory Visit: Admitting: Urology

## 2024-07-24 ENCOUNTER — Ambulatory Visit: Payer: Self-pay | Admitting: Urology

## 2024-07-30 DIAGNOSIS — D2261 Melanocytic nevi of right upper limb, including shoulder: Secondary | ICD-10-CM | POA: Diagnosis not present

## 2024-07-30 DIAGNOSIS — D2262 Melanocytic nevi of left upper limb, including shoulder: Secondary | ICD-10-CM | POA: Diagnosis not present

## 2024-07-30 DIAGNOSIS — D2271 Melanocytic nevi of right lower limb, including hip: Secondary | ICD-10-CM | POA: Diagnosis not present

## 2024-07-30 DIAGNOSIS — Z85828 Personal history of other malignant neoplasm of skin: Secondary | ICD-10-CM | POA: Diagnosis not present

## 2024-07-30 DIAGNOSIS — D2272 Melanocytic nevi of left lower limb, including hip: Secondary | ICD-10-CM | POA: Diagnosis not present

## 2024-09-02 ENCOUNTER — Encounter: Payer: Self-pay | Admitting: Urology

## 2024-09-02 ENCOUNTER — Ambulatory Visit: Admitting: Urology

## 2024-09-02 VITALS — BP 139/84 | HR 69 | Ht 68.0 in | Wt 228.0 lb

## 2024-09-02 DIAGNOSIS — N401 Enlarged prostate with lower urinary tract symptoms: Secondary | ICD-10-CM | POA: Diagnosis not present

## 2024-09-02 LAB — BLADDER SCAN AMB NON-IMAGING: Scan Result: 20

## 2024-09-02 MED ORDER — TAMSULOSIN HCL 0.4 MG PO CAPS: 0.4000 mg | ORAL_CAPSULE | Freq: Every day | ORAL | 0 refills | Status: DC

## 2024-09-02 NOTE — Progress Notes (Signed)
 09/02/2024 9:54 AM   Joel Carter [birth year] [id number]  Referring provider: Cleotilde Oneil FALCON, MD [phone] [facility] [address] [facility] [place],  [place] [address]  Chief Complaint  Patient presents with   Benign Prostatic Hypertrophy   Urologic history:  1.  BPH with LUTS UroLift 09/09/2019 Elected to stay on finasteride    2.  Bladder calculi Staged cystolitholapaxy 09/09/2019 and 09/16/2019   HPI: Joel Carter is a 83 y.o. male who presents for annual follow-up.  Since last year's visit he has noted some worsening of his lower urinary tract symptoms.  He states after voiding 5 minutes later he will get urge and have to void a small amount of urine Remains on finasteride  No dysuria or gross hematuria No flank, abdominal or pelvic pain   PMH: Past Medical History:  Diagnosis Date   Arthritis    BPH without obstruction/lower urinary tract symptoms    GERD (gastroesophageal reflux disease)    Wears hearing aid in both ears    Has, doesn't usually wear    Surgical History: Past Surgical History:  Procedure Laterality Date   CATARACT EXTRACTION W/PHACO Left 03/02/2021   Procedure: CATARACT EXTRACTION PHACO AND INTRAOCULAR LENS PLACEMENT (IOC) LEFT;  Surgeon: Mittie Gaskin, MD;  Location: [facility];  Service: Ophthalmology;  Laterality: Left;  CDE 11.87 01:17.0 minutes 15.4%   CATARACT EXTRACTION W/PHACO Right 01/04/2022   Procedure: CATARACT EXTRACTION PHACO AND INTRAOCULAR LENS PLACEMENT (IOC) RIGHT;  Surgeon: Mittie Gaskin, MD;  Location: [facility];  Service: Ophthalmology;  Laterality: Right;  6.71 01:14.6   COLONOSCOPY  2011   tubular adenoma   COLONOSCOPY WITH PROPOFOL  N/A 08/07/2017   Procedure: COLONOSCOPY WITH PROPOFOL ;  Surgeon: Gaylyn Gladis PENNER, MD;  Location: [facility];  Service: Endoscopy;  Laterality: N/A;   COLONOSCOPY WITH PROPOFOL  N/A 12/09/2021   Procedure: COLONOSCOPY WITH PROPOFOL ;   Surgeon: Maryruth Ole DASEN, MD;  Location: [facility];  Service: Endoscopy;  Laterality: N/A;   CYSTOSCOPY WITH INSERTION OF UROLIFT N/A 09/09/2019   Procedure: CYSTOSCOPY WITH INSERTION OF UROLIFT;  Surgeon: Twylla Glendia BROCKS, MD;  Location: [facility];  Service: Urology;  Laterality: N/A;   CYSTOSCOPY WITH LITHOLAPAXY N/A 09/09/2019   Procedure: CYSTOSCOPY WITH LITHOLAPAXY;  Surgeon: Twylla Glendia BROCKS, MD;  Location: [facility];  Service: Urology;  Laterality: N/A;   CYSTOSCOPY WITH LITHOLAPAXY N/A 09/16/2019   Procedure: CYSTOSCOPY WITH LITHOLAPAXY;  Surgeon: Twylla Glendia BROCKS, MD;  Location: [facility];  Service: Urology;  Laterality: N/A;   ESOPHAGOGASTRODUODENOSCOPY     TONSILLECTOMY      Home Medications:  Allergies as of 09/02/2024   No Known Allergies      Medication List        Accurate as of September 02, 2024  9:54 AM. If you have any questions, ask your nurse or doctor.          B COMPLEX 1 PO Take by mouth daily.   finasteride  5 MG tablet Commonly known as: PROSCAR  Take 1 tablet (5 mg total) by mouth daily.   multivitamin capsule Take 1 capsule by mouth daily.        Allergies: No Known Allergies  Family History: Family History  Problem Relation Age of Onset   Diabetes Other        multiple family members   Dementia Mother     Social History:  reports that he has never smoked. He has never used smokeless tobacco. He reports that he does not  currently use alcohol after a past usage of about 4.0 standard drinks of alcohol per week. He reports that he does not use drugs.   Physical Exam: BP 139/84   Pulse 69   Ht 5' 8 (1.727 m)   Wt 228 lb (103.4 kg)   BMI 34.67 kg/m   Constitutional:  Alert, No acute distress. HEENT: [place] AT Respiratory: Normal respiratory effort, no increased work of breathing. Psychiatric: Normal mood and affect.   Assessment & Plan:    1. Benign prostatic hyperplasia with LUTS PVR today 20 mL Present voiding symptoms  are bothersome enough that he desires further medical management Trial tamsulosin  0.4 mg daily x 30 days.  He had been on tamsulosin  prior to UroLift and tolerated it without problems Continue annual follow-up   Glendia JAYSON Barba, MD  [facility] [address], [address] [place], [place] [address] [phone]

## 2024-09-29 DIAGNOSIS — H40003 Preglaucoma, unspecified, bilateral: Secondary | ICD-10-CM | POA: Diagnosis not present

## 2024-10-02 ENCOUNTER — Other Ambulatory Visit: Payer: Self-pay | Admitting: Urology

## 2025-09-02 ENCOUNTER — Ambulatory Visit: Admitting: Urology
